# Patient Record
Sex: Female | Born: 1985 | Race: White | Hispanic: No | Marital: Married | Smoking: Never smoker
Health system: Southern US, Community
[De-identification: ages and names within clinical notes are randomized; demographics above are authoritative.]

## PROBLEM LIST (undated history)

## (undated) ENCOUNTER — Inpatient Hospital Stay (HOSPITAL_COMMUNITY): Payer: Self-pay

## (undated) DIAGNOSIS — G40009 Localization-related (focal) (partial) idiopathic epilepsy and epileptic syndromes with seizures of localized onset, not intractable, without status epilepticus: Secondary | ICD-10-CM

## (undated) DIAGNOSIS — C801 Malignant (primary) neoplasm, unspecified: Secondary | ICD-10-CM

## (undated) DIAGNOSIS — R569 Unspecified convulsions: Secondary | ICD-10-CM

## (undated) HISTORY — PX: WISDOM TOOTH EXTRACTION: SHX21

---

## 2009-04-01 ENCOUNTER — Emergency Department (HOSPITAL_COMMUNITY): Admission: EM | Admit: 2009-04-01 | Discharge: 2009-04-02 | Payer: Self-pay | Admitting: Emergency Medicine

## 2010-08-27 LAB — POCT I-STAT, CHEM 8
BUN: 9 mg/dL (ref 6–23)
Calcium, Ion: 1.16 mmol/L (ref 1.12–1.32)
Creatinine, Ser: 0.9 mg/dL (ref 0.4–1.2)
TCO2: 25 mmol/L (ref 0–100)

## 2010-08-27 LAB — URINALYSIS, ROUTINE W REFLEX MICROSCOPIC
Bilirubin Urine: NEGATIVE
Glucose, UA: NEGATIVE mg/dL
Hgb urine dipstick: NEGATIVE
Protein, ur: NEGATIVE mg/dL
Specific Gravity, Urine: 1.017 (ref 1.005–1.030)

## 2010-08-27 LAB — URINE CULTURE

## 2010-08-27 LAB — URINE MICROSCOPIC-ADD ON

## 2012-05-25 NOTE — L&D Delivery Note (Signed)
Delivery Note At 12:04 PM a viable and healthy female was delivered via Vaginal, Spontaneous Delivery (Presentation:ROA ;  ).  APGAR: 9, 9; weight 7lb 13 oz.   Placenta status: Intact, Spontaneous. Sent for path. CANx 1 reducibel Cord: 3 vessels with the following complications: None.  Cord pH: none  Anesthesia: Epidural  Episiotomy:none  Lacerations: Sulcus left vaginal Suture Repair: 3.0 chromic Est. Blood Loss (mL): 300  Mom to postpartum.  Baby to nursery-stable.  Darrly Loberg A 11/04/2012, 1:34 PM

## 2012-06-02 ENCOUNTER — Encounter: Payer: BC Managed Care – PPO | Attending: Obstetrics and Gynecology | Admitting: Dietician

## 2012-06-02 ENCOUNTER — Encounter: Payer: Self-pay | Admitting: Dietician

## 2012-06-02 VITALS — Ht 69.0 in | Wt 144.3 lb

## 2012-06-02 DIAGNOSIS — R638 Other symptoms and signs concerning food and fluid intake: Secondary | ICD-10-CM | POA: Insufficient documentation

## 2012-06-02 DIAGNOSIS — Z713 Dietary counseling and surveillance: Secondary | ICD-10-CM | POA: Insufficient documentation

## 2012-06-02 NOTE — Progress Notes (Signed)
  Medical Nutrition Therapy:  Appt start time: 1630 end time:  1730.   Assessment:  Primary concerns today: Concern for weight gain in pregnancy. Given her weight gain during the the first trimester, Gaylon was advised to gain weight and on hearing the need for increased weight, she has established an eating regimen for herself that has led to a gain of 7 lb since 05/06/2012.  She is feeling good.  Still has waves of nausea at times.  She is tolerating the increase in food.  She still has problems tolerating meats.  She is however using and tolerating PB, beans, and peas and cheese and other protein sources.  She is keeping a daily food diary and using My Fitness Pal and logging her calories and has a goal of at least 2200-2300 calories per day.     MEDICATIONS: Medication review completed.  Currently on pre-natal vitamins   DIETARY INTAKE:  Usual eating pattern includes 3 meals and 3 snacks per day.  Everyday foods include Fruits, vegetables, peanut butter. Marland Kitchen  Avoided foods include: Having more problems eating meat.    24-hr recall:  B ( AM): 8:30 600 calories PB and jelly toast, yogurt (greek) =/- banana, glass of OJ  Snk ( AM): 11:30 snack dry cereal or peanuts or fruit or instant oatmeal  L ( PM): 1:30 PM Leftovers Veggie lasagana, apple, blueberry bagel with cream cheese. water Snk ( PM): mid afternoon corn chips with cheese. D ( PM): 6:30-7:00 Pinto bean polenta pie. Salsa, chips cheese OR pizza with pesto and cheese and maybe mushrooms. 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  She is however, still having some fatigue and is looking to increase her activity.  Estimated energy needs:   Ht: 69 in  WT: 144.3 lb  BMI: 21.4 kg/m2   2200-2300calories 245-250 g carbohydrates Aim for 80 gm per day g protein 60-70 g fat  Progress Towards Goal(s):   Some progress.   Nutritional Diagnosis:  NI-1.4 Inadequate energy intake As related to limited weight gain in pregnancy during first trimester.  As evidenced by gain of 3 lb or less duing the first timester, episodes of nausea at that time accompanied by feelings of food aversions..    Intervention:  Nutrition Recommend continuing to keep your food diary.  Use it as a tool for 3-4 days out of a week for the next few weeks to try to keep you on track.  Continue to use the strategies that have worked for you and aim for the 2200-2300-2400 calories per day.  Begin to bring in the poultry when tolerated.  If fish feels like it could be tolerated, broiled or baked, then 3 oz twice a week can be used.  In the meanwhile continue to use the proteins in the form of the beans, nuts, nut butters and cheeses and yogurt ([place]).  Handouts given during visit include:  Eating Expectantly  Monitoring/Evaluation:  Dietary intake, exercise, and body weight when she feels she needs further assistance. She has my card and Brad Mcgaughy call or e-mail with questions.Marland Kitchen

## 2012-09-17 ENCOUNTER — Encounter (HOSPITAL_COMMUNITY): Payer: Self-pay | Admitting: *Deleted

## 2012-09-17 ENCOUNTER — Inpatient Hospital Stay (HOSPITAL_COMMUNITY)
Admission: AD | Admit: 2012-09-17 | Discharge: 2012-09-17 | Disposition: A | Discharge status: Home / Self Care | Payer: BC Managed Care – PPO | Source: Ambulatory Visit | Attending: Obstetrics and Gynecology | Admitting: Obstetrics and Gynecology

## 2012-09-17 DIAGNOSIS — E162 Hypoglycemia, unspecified: Secondary | ICD-10-CM | POA: Insufficient documentation

## 2012-09-17 DIAGNOSIS — O2650 Maternal hypotension syndrome, unspecified trimester: Secondary | ICD-10-CM | POA: Insufficient documentation

## 2012-09-17 DIAGNOSIS — R42 Dizziness and giddiness: Secondary | ICD-10-CM | POA: Insufficient documentation

## 2012-09-17 HISTORY — DX: Localization-related (focal) (partial) idiopathic epilepsy and epileptic syndromes with seizures of localized onset, not intractable, without status epilepticus: G40.009

## 2012-09-17 NOTE — MAU Note (Signed)
Pt reports she felt light headed and almost passed out lost vision for 30 seconds. This happen 20 min ago. Stated she did eat today. Denies pain and reports good fetal movement

## 2012-09-17 NOTE — MAU Provider Note (Signed)
  History     CSN: [id number]  Arrival date and time: 09/17/12 1154 Call to provider @ 1241 Provider here to see patient @ 1305     Chief Complaint  Patient presents with  . Near Syncope   HPI  Active FM Dizziness and lightheaded this am Felt like she would pass out  Ate granola bar and skim milk for breakfast / granola bar for snack and lunch  Past Medical History  Diagnosis Date  . Benign focal epilepsy of childhood     Past Surgical History  Procedure Laterality Date  . Wisdom tooth extraction      Family History  Problem Relation Age of Onset  . Cancer Mother   . Diverticulitis Father     History  Substance Use Topics  . Smoking status: Never Smoker   . Smokeless tobacco: Never Used  . Alcohol Use: No    Allergies: No Known Allergies  Prescriptions prior to admission  Medication Sig Dispense Refill  . OVER THE COUNTER MEDICATION Take 3 tablets by mouth daily. [employer] brand prenatal vitamins        ROS Physical Exam   Blood pressure 129/87, pulse 81, temperature 97.7 F (36.5 C), temperature source Oral, resp. rate 18, height 5\' 9"  (1.753 m), weight 75.932 kg (167 lb 6.4 oz), SpO2 100.00%.  Physical Exam NAD Alert and oriented Abdomen soft and non-tender  Pelvic - deferred  CBG 78 after snack on way to hospital  MAU Course  Procedures   Assessment and Plan  31 weeks Hypoglycemia episode - discussed dietary needs in third trimester Need for adequate protein intake with meals       especially breakfast after fasting all night and before HS in preparation of fasting all night Increase water intake Discussed good options for protein intake  DC home ROB with Dr Cherly Hensen Wed at [facility] 09/17/2012, 1:14 PM

## 2012-09-17 NOTE — MAU Note (Signed)
Patient presents to MAU with c/o feeling nauseous and seeing black spots, then briefly losing vision at 1140 today. Denies loss of consciousness or fall. Denies DM; reports routine breakfast of granola with skim milk and granola bar snacks.  Reports + fetal movement, denies vaginal bleeding, LOF or cramping.

## 2012-11-04 ENCOUNTER — Inpatient Hospital Stay (HOSPITAL_COMMUNITY)
Admission: AD | Admit: 2012-11-04 | Discharge: 2012-11-06 | DRG: 373 | Disposition: A | Discharge status: Home / Self Care | Payer: BC Managed Care – PPO | Source: Ambulatory Visit | Attending: Obstetrics and Gynecology | Admitting: Obstetrics and Gynecology

## 2012-11-04 ENCOUNTER — Encounter (HOSPITAL_COMMUNITY): Payer: Self-pay | Admitting: *Deleted

## 2012-11-04 ENCOUNTER — Inpatient Hospital Stay (HOSPITAL_COMMUNITY): Payer: BC Managed Care – PPO | Admitting: Anesthesiology

## 2012-11-04 ENCOUNTER — Encounter (HOSPITAL_COMMUNITY): Payer: Self-pay | Admitting: Anesthesiology

## 2012-11-04 DIAGNOSIS — O99892 Other specified diseases and conditions complicating childbirth: Secondary | ICD-10-CM | POA: Diagnosis present

## 2012-11-04 DIAGNOSIS — Z22330 Carrier of Group B streptococcus: Secondary | ICD-10-CM

## 2012-11-04 LAB — CBC
MCHC: 35 g/dL (ref 30.0–36.0)
Platelets: 269 10*3/uL (ref 150–400)
RDW: 12.5 % (ref 11.5–15.5)
WBC: 10 10*3/uL (ref 4.0–10.5)

## 2012-11-04 LAB — OB RESULTS CONSOLE HEPATITIS B SURFACE ANTIGEN: Hepatitis B Surface Ag: NEGATIVE

## 2012-11-04 LAB — RPR: RPR Ser Ql: NONREACTIVE

## 2012-11-04 LAB — OB RESULTS CONSOLE GC/CHLAMYDIA: Chlamydia: NEGATIVE

## 2012-11-04 LAB — OB RESULTS CONSOLE RPR: RPR: NONREACTIVE

## 2012-11-04 LAB — OB RESULTS CONSOLE RUBELLA ANTIBODY, IGM: Rubella: IMMUNE

## 2012-11-04 LAB — OB RESULTS CONSOLE HIV ANTIBODY (ROUTINE TESTING): HIV: NONREACTIVE

## 2012-11-04 MED ORDER — FLEET ENEMA 7-19 GM/118ML RE ENEM: 1.0000 | ENEMA | RECTAL | Status: DC | PRN

## 2012-11-04 MED ORDER — OXYTOCIN 40 UNITS IN LACTATED RINGERS INFUSION - SIMPLE MED: 62.5000 mL/h | INTRAVENOUS | Status: DC

## 2012-11-04 MED ORDER — PENICILLIN G POTASSIUM 5000000 UNITS IJ SOLR
2.5000 10*6.[IU] | INTRAVENOUS | Status: DC
  Administered 2012-11-04: 2.5 10*6.[IU] via INTRAVENOUS
  Filled 2012-11-04 (×6): qty 2.5

## 2012-11-04 MED ORDER — SIMETHICONE 80 MG PO CHEW: 80.0000 mg | CHEWABLE_TABLET | ORAL | Status: DC | PRN

## 2012-11-04 MED ORDER — PENICILLIN G POTASSIUM 5000000 UNITS IJ SOLR
5.0000 10*6.[IU] | Freq: Once | INTRAVENOUS | Status: AC
  Administered 2012-11-04: 5 10*6.[IU] via INTRAVENOUS
  Filled 2012-11-04: qty 5

## 2012-11-04 MED ORDER — LIDOCAINE HCL (PF) 1 % IJ SOLN
30.0000 mL | INTRAMUSCULAR | Status: DC | PRN
  Filled 2012-11-04 (×2): qty 30

## 2012-11-04 MED ORDER — OXYCODONE-ACETAMINOPHEN 5-325 MG PO TABS
1.0000 | ORAL_TABLET | ORAL | Status: DC | PRN
Start: 2012-11-04 — End: 2012-11-04

## 2012-11-04 MED ORDER — NALBUPHINE SYRINGE 5 MG/0.5 ML
10.0000 mg | INJECTION | INTRAMUSCULAR | Status: DC | PRN
  Filled 2012-11-04 (×2): qty 1

## 2012-11-04 MED ORDER — CITRIC ACID-SODIUM CITRATE 334-500 MG/5ML PO SOLN: 30.0000 mL | ORAL | Status: DC | PRN

## 2012-11-04 MED ORDER — BENZOCAINE-MENTHOL 20-0.5 % EX AERO
1.0000 "application " | INHALATION_SPRAY | CUTANEOUS | Status: DC | PRN
  Administered 2012-11-04: 1 via TOPICAL
  Filled 2012-11-04: qty 56

## 2012-11-04 MED ORDER — DIPHENHYDRAMINE HCL 25 MG PO CAPS: 25.0000 mg | ORAL_CAPSULE | Freq: Four times a day (QID) | ORAL | Status: DC | PRN

## 2012-11-04 MED ORDER — ZOLPIDEM TARTRATE 5 MG PO TABS: 5.0000 mg | ORAL_TABLET | Freq: Every evening | ORAL | Status: DC | PRN

## 2012-11-04 MED ORDER — DIPHENHYDRAMINE HCL 50 MG/ML IJ SOLN: 12.5000 mg | INTRAMUSCULAR | Status: DC | PRN

## 2012-11-04 MED ORDER — EPHEDRINE 5 MG/ML INJ
10.0000 mg | INTRAVENOUS | Status: DC | PRN
  Filled 2012-11-04: qty 2

## 2012-11-04 MED ORDER — ONDANSETRON HCL 4 MG/2ML IJ SOLN: 4.0000 mg | Freq: Four times a day (QID) | INTRAMUSCULAR | Status: DC | PRN

## 2012-11-04 MED ORDER — ONDANSETRON HCL 4 MG PO TABS: 4.0000 mg | ORAL_TABLET | ORAL | Status: DC | PRN

## 2012-11-04 MED ORDER — OXYCODONE-ACETAMINOPHEN 5-325 MG PO TABS
1.0000 | ORAL_TABLET | ORAL | Status: DC | PRN
  Filled 2012-11-04: qty 2

## 2012-11-04 MED ORDER — SENNOSIDES-DOCUSATE SODIUM 8.6-50 MG PO TABS
2.0000 | ORAL_TABLET | Freq: Every day | ORAL | Status: DC
  Administered 2012-11-05 (×2): 2 via ORAL

## 2012-11-04 MED ORDER — ACETAMINOPHEN 325 MG PO TABS: 650.0000 mg | ORAL_TABLET | ORAL | Status: DC | PRN

## 2012-11-04 MED ORDER — TETANUS-DIPHTH-ACELL PERTUSSIS 5-2.5-18.5 LF-MCG/0.5 IM SUSP
0.5000 mL | Freq: Once | INTRAMUSCULAR | Status: AC
  Administered 2012-11-05: 0.5 mL via INTRAMUSCULAR
  Filled 2012-11-04: qty 0.5

## 2012-11-04 MED ORDER — OXYTOCIN BOLUS FROM INFUSION: 500.0000 mL | INTRAVENOUS | Status: DC

## 2012-11-04 MED ORDER — IBUPROFEN 600 MG PO TABS: 600.0000 mg | ORAL_TABLET | Freq: Four times a day (QID) | ORAL | Status: DC | PRN

## 2012-11-04 MED ORDER — LANOLIN HYDROUS EX OINT: TOPICAL_OINTMENT | CUTANEOUS | Status: DC | PRN

## 2012-11-04 MED ORDER — SODIUM BICARBONATE 8.4 % IV SOLN
INTRAVENOUS | Status: DC | PRN
  Administered 2012-11-04: 5 mL via EPIDURAL

## 2012-11-04 MED ORDER — LACTATED RINGERS IV SOLN
INTRAVENOUS | Status: DC
  Administered 2012-11-04: 02:00:00 via INTRAVENOUS

## 2012-11-04 MED ORDER — LACTATED RINGERS IV SOLN: 500.0000 mL | Freq: Once | INTRAVENOUS | Status: DC

## 2012-11-04 MED ORDER — LACTATED RINGERS IV SOLN: 500.0000 mL | INTRAVENOUS | Status: DC | PRN

## 2012-11-04 MED ORDER — PHENYLEPHRINE 40 MCG/ML (10ML) SYRINGE FOR IV PUSH (FOR BLOOD PRESSURE SUPPORT)
80.0000 ug | PREFILLED_SYRINGE | INTRAVENOUS | Status: DC | PRN
  Filled 2012-11-04: qty 2

## 2012-11-04 MED ORDER — WITCH HAZEL-GLYCERIN EX PADS: 1.0000 "application " | MEDICATED_PAD | CUTANEOUS | Status: DC | PRN

## 2012-11-04 MED ORDER — ONDANSETRON HCL 4 MG/2ML IJ SOLN: 4.0000 mg | INTRAMUSCULAR | Status: DC | PRN

## 2012-11-04 MED ORDER — PHENYLEPHRINE 40 MCG/ML (10ML) SYRINGE FOR IV PUSH (FOR BLOOD PRESSURE SUPPORT)
80.0000 ug | PREFILLED_SYRINGE | INTRAVENOUS | Status: DC | PRN
  Filled 2012-11-04: qty 2
  Filled 2012-11-04: qty 5

## 2012-11-04 MED ORDER — FENTANYL 2.5 MCG/ML BUPIVACAINE 1/10 % EPIDURAL INFUSION (WH - ANES)
14.0000 mL/h | INTRAMUSCULAR | Status: DC | PRN
  Administered 2012-11-04: 14 mL/h via EPIDURAL
  Filled 2012-11-04: qty 125

## 2012-11-04 MED ORDER — IBUPROFEN 600 MG PO TABS
600.0000 mg | ORAL_TABLET | Freq: Four times a day (QID) | ORAL | Status: DC
  Administered 2012-11-05 – 2012-11-06 (×3): 600 mg via ORAL
  Filled 2012-11-04 (×4): qty 1

## 2012-11-04 MED ORDER — PRENATAL MULTIVITAMIN CH
1.0000 | ORAL_TABLET | Freq: Every day | ORAL | Status: DC
  Administered 2012-11-05 – 2012-11-06 (×2): 1 via ORAL
  Filled 2012-11-04 (×2): qty 1

## 2012-11-04 MED ORDER — EPHEDRINE 5 MG/ML INJ
10.0000 mg | INTRAVENOUS | Status: DC | PRN
  Filled 2012-11-04: qty 2
  Filled 2012-11-04: qty 4

## 2012-11-04 MED ORDER — FERROUS SULFATE 325 (65 FE) MG PO TABS
325.0000 mg | ORAL_TABLET | Freq: Two times a day (BID) | ORAL | Status: DC
  Administered 2012-11-05: 325 mg via ORAL
  Administered 2012-11-05: 12:00:00 via ORAL
  Administered 2012-11-06: 325 mg via ORAL
  Filled 2012-11-04 (×3): qty 1

## 2012-11-04 MED ORDER — OXYTOCIN 40 UNITS IN LACTATED RINGERS INFUSION - SIMPLE MED
1.0000 m[IU]/min | INTRAVENOUS | Status: DC
  Administered 2012-11-04: 2 m[IU]/min via INTRAVENOUS
  Filled 2012-11-04: qty 1000

## 2012-11-04 MED ORDER — TERBUTALINE SULFATE 1 MG/ML IJ SOLN: 0.2500 mg | Freq: Once | INTRAMUSCULAR | Status: DC | PRN

## 2012-11-04 MED ORDER — DIBUCAINE 1 % RE OINT: 1.0000 "application " | TOPICAL_OINTMENT | RECTAL | Status: DC | PRN

## 2012-11-04 NOTE — MAU Note (Signed)
Pt presents with srom at 2330 this pm.  Clear fluid per pt.  Pt with + fm.  Denies bleeding or pih symptoms.  + GBBS

## 2012-11-04 NOTE — Plan of Care (Signed)
Problem: Phase I Progression Outcomes Goal: Pain controlled with appropriate interventions Outcome: Completed/Met Date Met:  11/04/12 Pt. Has no hx of pain medications except tylenol, excedrin, etc. Prefers to avoid pain medications and   narcotics, explained that medication is as needed with Motrin scheduled every 6 hours.

## 2012-11-04 NOTE — H&P (Signed)
Krystal Jefferson is a 27 y.o. female presenting in labor with SROM  (+) FM GBS cx (+) Maternal Medical History:  Reason for admission: Rupture of membranes.   Contractions: Onset was 3-5 hours ago.   Frequency: irregular.   Perceived severity is mild.    Fetal activity: Perceived fetal activity is normal.    Prenatal complications: no prenatal complications Prenatal Complications - Diabetes: none.    OB History   Grav Para Term Preterm Abortions TAB SAB Ect Mult Living   1              Past Medical History  Diagnosis Date  . Benign focal epilepsy of childhood    Past Surgical History  Procedure Laterality Date  . Wisdom tooth extraction     Family History: family history includes Cancer in her mother and Diverticulitis in her father. Social History:  reports that she has never smoked. She has never used smokeless tobacco. She reports that she does not drink alcohol or use illicit drugs.   Prenatal Transfer Tool  Maternal Diabetes: No Genetic Screening: Normal Maternal Ultrasounds/Referrals: Normal Fetal Ultrasounds or other Referrals:  None Maternal Substance Abuse:  No Significant Maternal Medications:  None Significant Maternal Lab Results:  Lab values include: Group B Strep positive Other Comments:  None  Review of Systems  All other systems reviewed and are negative.    Dilation: 6 Effacement (%): 100 Station: +1;0 Exam by:: a. white e. foley rn Blood pressure 134/69, pulse 75, temperature 98.4 F (36.9 C), temperature source Oral, resp. rate 20, height 5\' 9"  (1.753 m), weight 78.926 kg (174 lb). Maternal Exam:  Uterine Assessment: Contraction strength is mild.  Abdomen: Patient reports no abdominal tenderness. Estimated fetal weight is 7lb.   Fetal presentation: vertex  Introitus: Normal vulva. Amniotic fluid character: clear.  Cervix: Cervix evaluated by digital exam.     Physical Exam  Constitutional: She is oriented to person, place, and time.  She appears well-developed and well-nourished.  HENT:  Head: Atraumatic.  Eyes: EOM are normal.  Neck: Neck supple.  Cardiovascular: Normal rate and regular rhythm.   Respiratory: Breath sounds normal.  GI: Soft.  Musculoskeletal: She exhibits no edema.  Neurological: She is alert and oriented to person, place, and time.  Skin: Skin is warm and dry.  Psychiatric: She has a normal mood and affect.    Prenatal labs: ABO, Rh: A/Positive/-- (06/13 0000) Antibody: Negative (06/13 0000) Rubella: Immune (06/13 0000) RPR: Nonreactive (06/13 0000)  HBsAg: Negative (06/13 0000)  HIV: Non-reactive (06/13 0000)  GBS: Positive (06/13 0000)   Assessment/Plan: SROM Term gestation GBS cx (+) P) admit routine labs. IV PCN epidural prn. Pitocin augmentation   Krystal Jefferson A 11/04/2012, 5:23 AM

## 2012-11-04 NOTE — Anesthesia Procedure Notes (Signed)

## 2012-11-04 NOTE — Anesthesia Postprocedure Evaluation (Signed)
Anesthesia Post Note  Patient: Krystal Jefferson  Procedure(s) Performed: * No procedures listed *  Anesthesia type: Epidural  Patient location: Mother/Baby  Post pain: Pain level controlled  Post assessment: Post-op Vital signs reviewed  Last Vitals:  Filed Vitals:   11/04/12 1331  BP: 135/78  Pulse: 89  Temp:   Resp: 16    Post vital signs: Reviewed  Level of consciousness:alert  Complications: No apparent anesthesia complications

## 2012-11-04 NOTE — Anesthesia Preprocedure Evaluation (Signed)

## 2012-11-04 NOTE — Progress Notes (Signed)
Krystal Jefferson is a 27 y.o. G1P0 at [redacted]w[redacted]d by LMP admitted for active labor, rupture of membranes GBS cx (+)  Subjective: Chief Complaint  Patient presents with  . Labor Eval    Objective: BP 128/73  Pulse 76  Temp(Src) 98.1 F (36.7 C) (Oral)  Resp 16  Ht 5\' 9"  (1.753 m)  Wt 78.926 kg (174 lb)  BMI 25.68 kg/m2  SpO2 100%      FHT:  FHR: 145 bpm, variability: minimal ,  accelerations:  Present,  decelerations:  Absent UC:   regular, every 2-3 minutes SVE:   Complete/ (+)1/2 station  asynclytic LOP Tracing: reassuring Cat 1  Labs: Lab Results  Component Value Date   WBC 10.0 11/04/2012   HGB 12.5 11/04/2012   HCT 35.7* 11/04/2012   MCV 86.9 11/04/2012   PLT 269 11/04/2012    Assessment / Plan: Augmentation of labor, progressing well SROM on IV PCN Term gestation P) left exaggerated sims. Labor vtx down Foley catheter  Anticipated MOD:  NSVD  Breken Nazari A 11/04/2012, 7:45 AM

## 2012-11-05 ENCOUNTER — Encounter (HOSPITAL_COMMUNITY): Payer: Self-pay | Admitting: Obstetrics and Gynecology

## 2012-11-05 LAB — CBC
HCT: 36.1 % (ref 36.0–46.0)
Hemoglobin: 12.2 g/dL (ref 12.0–15.0)
MCH: 30 pg (ref 26.0–34.0)
MCHC: 33.8 g/dL (ref 30.0–36.0)
RBC: 4.07 MIL/uL (ref 3.87–5.11)

## 2012-11-05 NOTE — Lactation Note (Signed)
This note was copied from the chart of Girl Byron Tipping. Lactation Consultation Note  Patient Name: Girl Clorene Nerio Today's Date: 11/05/2012 Reason for consult: Initial assessment   Maternal Data Formula Feeding for Exclusion: No Infant to breast within first hour of birth: Yes Has patient been taught Hand Expression?: Yes Does the patient have breastfeeding experience prior to this delivery?: No  Feeding Feeding Type: Breast Milk Feeding method: Breast Length of feed: 10 min  LATCH Score/Interventions Latch: Grasps breast easily, tongue down, lips flanged, rhythmical sucking.  Audible Swallowing: A few with stimulation  Type of Nipple: Everted at rest and after stimulation  Comfort (Breast/Nipple): Filling, red/small blisters or bruises, mild/mod discomfort  Problem noted: Mild/Moderate discomfort Interventions (Mild/moderate discomfort): Comfort gels  Hold (Positioning): Assistance needed to correctly position infant at breast and maintain latch. Intervention(s): Breastfeeding basics reviewed;Support Pillows;Skin to skin  LATCH Score: 7  Lactation Tools Discussed/Used Tools: Comfort gels   Consult Status Consult Status: Follow-up Date: 11/06/12 Follow-up type: In-patient  Initial visit with mom. Left nipple very sore per mom- raw on tip and bruising noted on areola. Assisted mom with latch to right breast in football hold. Mom reports that it hurts through the first few minutes but then eases off. Baby with tight frenulum but is able to extend tongue slightly over gumline. Dr Tommi Emery in and discussed frenulum with her. Mom had her frenulum clipped at age 17. Comfort gels given with instructions for use and cleaning. Reviewed basic teaching with parents. No questions at present. BF brochure given with resources for support after DC. To call prn.  Pamelia Hoit 11/05/2012, 9:18 AM

## 2012-11-05 NOTE — Progress Notes (Signed)
Patient ID: Krystal Jefferson, female   DOB: 09-24-1985, 27 y.o.   MRN: [id number] PPD # 1  Subjective: Pt reports feeling well/ Pain controlled with rare ibuprofen Tolerating po/ Voiding without problems/ No n/v Bleeding is light Newborn info:  Information for the patient's newborn:  Marijke, [place] [[id number]]  female Feeding: breast   Objective:  VS: Blood pressure 125/78, pulse 69, temperature 97.4 F (36.3 C), temperature source Oral, resp. rate 18.   Recent Labs  11/04/12 0140 11/05/12 0630  WBC 10.0 13.0*  HGB 12.5 12.2  HCT 35.7* 36.1  PLT 269 235    Blood type: A/Positive/-- (06/13 0000) Rubella: Immune (06/13 0000)    Physical Exam:  General: A & O x 3  alert, cooperative and no distress CV: Regular rate and rhythm Resp: clear Abdomen: soft, nontender, normal bowel sounds Uterine Fundus: firm, below umbilicus, nontender Perineum: healing with good reapproximation Lochia: minimal Ext: edema trace and Homans sign is negative, no sign of DVT   A/P: PPD # 1/ G1P1001/ S/P: SVD w/ sulcus repair Doing well Continue routine post partum orders Anticipate D/C home in AM    Demetrius Revel, MSN, [facility] 11/05/2012, 9:50 AM

## 2012-11-06 MED ORDER — IBUPROFEN 600 MG PO TABS: 600.0000 mg | ORAL_TABLET | Freq: Four times a day (QID) | ORAL | Status: DC | PRN

## 2012-11-06 NOTE — Discharge Summary (Signed)
Obstetric Discharge Summary Reason for Admission: G1 P0 @ 38w 5d with SROM, spontaneous labor; GBS Pos Prenatal Procedures: NST and ultrasound Intrapartum Procedures: spontaneous vaginal delivery Postpartum Procedures: none Complications-Operative and Postpartum: vaginal laceration Left sulcus Hemoglobin  Date Value Range Status  11/05/2012 12.2  12.0 - 15.0 g/dL Final     HCT  Date Value Range Status  11/05/2012 36.1  36.0 - 46.0 % Final    Physical Exam:  General: alert, cooperative and no distress Lochia: appropriate Uterine Fundus: firm Incision: na DVT Evaluation: No evidence of DVT seen on physical exam. Negative Homan's sign.  Discharge Diagnoses: G1 P1 S/P SVD @ 39wks with left vaginal sulcus laceration  Discharge Information: Date: 11/06/2012 Activity: pelvic rest Diet: routine Medications: PNV, Ibuprofen and Colace Condition: stable Instructions: refer to practice specific booklet Discharge to: home Follow-up Information   Follow up with COUSINS,SHERONETTE A, MD In 6 weeks.   Contact information:   [address] [place] [address] [phone]       Newborn Data: Live born female on 11/04/12 Birth Weight: 7 lb 13 oz (3544 g) APGAR: 9, 9  Home with mother.  Cherise Fedder K 11/06/2012, 9:01 AM

## 2012-11-06 NOTE — Progress Notes (Signed)
Patient ID: Krystal Jefferson, female   DOB: May 23, 1986, 27 y.o.   MRN: [id number] PPD # 2  Subjective: Pt reports feeling well and desires d/c home.  Moderate anxiety r/t infant with tight frenulum and in need of surgical repair.  Has been reassured by peds ok to go home Pain controlled with ibuprofen Tolerating po/ Voiding without problems/ No n/v Bleeding is light/ Newborn info:  Information for the patient's newborn:  Krystal Jefferson, [name] [[id number]]  female  Feeding: breast    Objective:  VS: Blood pressure 115/73, pulse 82, temperature 97.9 F (36.6 C), temperature source Oral, resp. rate 18.    Recent Labs  11/04/12 0140 11/05/12 0630  WBC 10.0 13.0*  HGB 12.5 12.2  HCT 35.7* 36.1  PLT 269 235    Blood type: A/Positive/-- (06/13 0000) Rubella: Immune (06/13 0000)    Physical Exam:  General: A & O x 3  alert, cooperative and no distress CV: Regular rate and rhythm Resp: clear Abdomen: soft, nontender, normal bowel sounds Uterine Fundus: firm, below umbilicus, nontender Perineum: healing with good reapproximation Lochia: minimal Ext: Homans sign is negative, no sign of DVT and no edema, redness or tenderness in the calves or thighs    A/P: PPD # 2/ G1P1001/ S/P: SVD, w/sulcus tear Doing well and stable for discharge home RX: Ibuprofen 600mg  po Q 6 hrs prn pain #30 Refill x 1 WOB/GYN booklet given Routine pp visit in 6wks   Krystal Revel, MSN, [facility] 11/06/2012, 9:00 AM

## 2012-11-10 ENCOUNTER — Ambulatory Visit (HOSPITAL_COMMUNITY)
Admission: RE | Admit: 2012-11-10 | Discharge: 2012-11-10 | Disposition: A | Discharge status: Home / Self Care | Payer: BC Managed Care – PPO | Source: Ambulatory Visit | Attending: Obstetrics and Gynecology | Admitting: Obstetrics and Gynecology

## 2012-11-10 NOTE — Lactation Note (Signed)
Adult Lactation Consultation Outpatient Visit Note  Patient Name: Krystal Jefferson(MOTHER)   BABY: Krystal Jefferson Date of Birth: Sep 13, 1985                                   DOB; [birth year] Gestational Age at Delivery: [redacted]w[redacted]d                BIRTH WEIGHT: 7-13 Type of Delivery: SVD                                     DISCHARGE WEIGHT: 7-6                                                                          WEIGHT TODAY:7-10.6 Breastfeeding History: Frequency of Breastfeeding: every 2 hours Length of Feeding: 45 minutes Voids: 6+ Stools:6 yellow   Supplementing / Method:None Pumping:  Type of Pump:DEBP   Frequency:2X/DAY  Volume:  FOR COMFORT ONLY 20-30 MLS  Comments:    Consultation Evaluation:Mom and 6 day old infant here for feeding assessment.  Mom experienced cracked nipples in the hospital and it was noted by [facility] and pediatrician that baby had a tight anterior frenulum.  Parent's were referred to an oral surgeon for possible frenectomy. Parent's state that baby saw oral surgeon today and frenectomy done without problems.  Mom put newborn to breast immediately after and could tell much improvement in comfort.  Mom reports no further nipple pain with feedings.  Observed mom latch baby to breast using cross cradle hold.  Baby initially obtained shallow latch.  Techniques for holding breast and breast compression demonstrated to parents to assist with easier and deeper latch.  Baby opened wide and latched deeper using techniques.  Breasts are full with duct fullness in areas.  Instructed on breast massage to increase milk flow.  Breast softened significantly with feeding. Initial Feeding Assessment:LEFT BREAST X 30 MINUTES Pre-feed [id number] Post-feed [id number] Amount Transferred:38 MLS Comments:  Additional Feeding Assessment: Pre-feed Weight: Post-feed Weight: Amount Transferred: Comments:  Additional Feeding Assessment: Pre-feed Weight: Post-feed Weight: Amount  Transferred: Comments:  Total Breast milk Transferred this Visit: 38 MLS Total Supplement Given: NONE  Additional Interventions:   Follow-Up  PEDI 2 WEEKS, WILL CALL LC OFFICE PRN AND TRY TO ATTEND BF SUPPORT GROUP      Hansel Feinstein 11/10/2012, 11:17 AM

## 2014-03-08 ENCOUNTER — Other Ambulatory Visit: Payer: Self-pay | Admitting: Internal Medicine

## 2014-03-08 DIAGNOSIS — E041 Nontoxic single thyroid nodule: Secondary | ICD-10-CM

## 2014-03-16 ENCOUNTER — Ambulatory Visit (HOSPITAL_COMMUNITY): Payer: BC Managed Care – PPO

## 2014-03-21 ENCOUNTER — Ambulatory Visit (HOSPITAL_COMMUNITY)
Admission: RE | Admit: 2014-03-21 | Discharge: 2014-03-21 | Disposition: A | Discharge status: Home / Self Care | Payer: BC Managed Care – PPO | Source: Ambulatory Visit | Attending: Interventional Radiology | Admitting: Interventional Radiology

## 2014-03-21 DIAGNOSIS — E041 Nontoxic single thyroid nodule: Secondary | ICD-10-CM | POA: Insufficient documentation

## 2014-03-26 ENCOUNTER — Encounter (HOSPITAL_COMMUNITY): Payer: Self-pay | Admitting: Obstetrics and Gynecology

## 2014-03-28 ENCOUNTER — Other Ambulatory Visit: Payer: Self-pay | Admitting: Internal Medicine

## 2014-03-28 DIAGNOSIS — E041 Nontoxic single thyroid nodule: Secondary | ICD-10-CM

## 2014-04-04 ENCOUNTER — Other Ambulatory Visit (HOSPITAL_COMMUNITY)
Admission: RE | Admit: 2014-04-04 | Discharge: 2014-04-04 | Disposition: A | Discharge status: Home / Self Care | Payer: BC Managed Care – PPO | Source: Ambulatory Visit | Attending: Interventional Radiology | Admitting: Interventional Radiology

## 2014-04-04 ENCOUNTER — Ambulatory Visit
Admission: RE | Admit: 2014-04-04 | Discharge: 2014-04-04 | Disposition: A | Discharge status: Home / Self Care | Payer: BC Managed Care – PPO | Source: Ambulatory Visit | Attending: Internal Medicine | Admitting: Internal Medicine

## 2014-04-04 DIAGNOSIS — E041 Nontoxic single thyroid nodule: Secondary | ICD-10-CM | POA: Insufficient documentation

## 2014-05-30 ENCOUNTER — Ambulatory Visit (INDEPENDENT_AMBULATORY_CARE_PROVIDER_SITE_OTHER): Payer: Self-pay | Admitting: Surgery

## 2014-06-12 NOTE — Patient Instructions (Signed)
Krystal Jefferson  06/12/2014   Your procedure is scheduled on: Monday January 25th, 2016  Report to [facility] Main  Entrance and follow signs to               [place] at 1130 AM.  Call this number if you have problems the morning of surgery [phone]   Remember:  Do not eat food :After Midnight night before surgery, may have clear liquids from midnight night before surgery until 0730 am day of surgery, then nothing by mouth after 730 am day of surgery.   CLEAR LIQUID DIET   Foods Allowed                                                                     Foods Excluded  Coffee and tea, regular and decaf                             liquids that you cannot  Plain Jell-O in any flavor                                             see through such as: Fruit ices (not with fruit pulp)                                     milk, soups, orange juice  Iced Popsicles                                    All solid food Carbonated beverages, regular and diet                                    Cranberry, grape and apple juices Sports drinks like Gatorade Lightly seasoned clear broth or consume(fat free) Sugar, honey syrup  Sample Menu Breakfast                                Lunch                                     Supper Cranberry juice                    Beef broth                            Chicken broth Jell-O                                     Grape juice  Apple juice Coffee or tea                        Jell-O                                      Popsicle                                                Coffee or tea                        Coffee or tea  _____________________________________________________________________       Take these medicines the morning of surgery with A SIP OF WATER:                                You may not have any metal on your body including hair pins and              piercings  Do not wear  jewelry, make-up, lotions, powders or perfumes.             Do not wear nail polish.  Do not shave  48 hours prior to surgery.              Men may shave face and neck.   Do not bring valuables to the hospital. [place].  Contacts, dentures or bridgework may not be worn into surgery.  Leave suitcase in the car. After surgery it may be brought to your room.     Patients discharged the day of surgery will not be allowed to drive home.  Name and phone number of your driver:  Special Instructions: N/A              Please read over the following fact sheets you were given: _____________________________________________________________________             [facility] - Preparing for Surgery Before surgery, you can play an important role.  Because skin is not sterile, your skin needs to be as free of germs as possible.  You can reduce the number of germs on your skin by washing with CHG (chlorahexidine gluconate) soap before surgery.  CHG is an antiseptic cleaner which kills germs and bonds with the skin to continue killing germs even after washing. Please DO NOT use if you have an allergy to CHG or antibacterial soaps.  If your skin becomes reddened/irritated stop using the CHG and inform your nurse when you arrive at Short Stay. Do not shave (including legs and underarms) for at least 48 hours prior to the first CHG shower.  You may shave your face/neck. Please follow these instructions carefully:  1.  Shower with CHG Soap the night before surgery and the  morning of Surgery.  2.  If you choose to wash your hair, wash your hair first as usual with your  normal  shampoo.  3.  After you shampoo, rinse your hair and body thoroughly to remove the  shampoo.  4.  Use CHG as you would any other liquid soap.  You can apply chg directly  to the skin and wash                       Gently with a scrungie or clean washcloth.  5.   Apply the CHG Soap to your body ONLY FROM THE NECK DOWN.   Do not use on face/ open                           Wound or open sores. Avoid contact with eyes, ears mouth and genitals (private parts).                       Wash face,  Genitals (private parts) with your normal soap.             6.  Wash thoroughly, paying special attention to the area where your surgery  will be performed.  7.  Thoroughly rinse your body with warm water from the neck down.  8.  DO NOT shower/wash with your normal soap after using and rinsing off  the CHG Soap.                9.  Pat yourself dry with a clean towel.            10.  Wear clean pajamas.            11.  Place clean sheets on your bed the night of your first shower and do not  sleep with pets. Day of Surgery : Do not apply any lotions/deodorants the morning of surgery.  Please wear clean clothes to the hospital/surgery center.  FAILURE TO FOLLOW THESE INSTRUCTIONS MAY RESULT IN THE CANCELLATION OF YOUR SURGERY PATIENT SIGNATURE_________________________________  NURSE SIGNATURE__________________________________  ________________________________________________________________________

## 2014-06-13 ENCOUNTER — Ambulatory Visit (HOSPITAL_COMMUNITY)
Admission: RE | Admit: 2014-06-13 | Discharge: 2014-06-13 | Disposition: A | Discharge status: Home / Self Care | Payer: BLUE CROSS/BLUE SHIELD | Source: Ambulatory Visit | Attending: Anesthesiology | Admitting: Anesthesiology

## 2014-06-13 ENCOUNTER — Encounter (HOSPITAL_COMMUNITY)
Admission: RE | Admit: 2014-06-13 | Discharge: 2014-06-13 | Disposition: A | Discharge status: Home / Self Care | Payer: BLUE CROSS/BLUE SHIELD | Source: Ambulatory Visit | Attending: Surgery | Admitting: Surgery

## 2014-06-13 ENCOUNTER — Encounter (HOSPITAL_COMMUNITY): Payer: Self-pay

## 2014-06-13 DIAGNOSIS — C73 Malignant neoplasm of thyroid gland: Secondary | ICD-10-CM | POA: Diagnosis not present

## 2014-06-13 DIAGNOSIS — Z01818 Encounter for other preprocedural examination: Secondary | ICD-10-CM | POA: Diagnosis not present

## 2014-06-13 DIAGNOSIS — Z01812 Encounter for preprocedural laboratory examination: Secondary | ICD-10-CM | POA: Diagnosis present

## 2014-06-13 HISTORY — DX: Unspecified convulsions: R56.9

## 2014-06-13 HISTORY — DX: Malignant (primary) neoplasm, unspecified: C80.1

## 2014-06-13 LAB — CBC
HCT: 44 % (ref 36.0–46.0)
Hemoglobin: 15.6 g/dL — ABNORMAL HIGH (ref 12.0–15.0)
MCH: 31.6 pg (ref 26.0–34.0)
MCHC: 35.5 g/dL (ref 30.0–36.0)
MCV: 89.1 fL (ref 78.0–100.0)
PLATELETS: 235 10*3/uL (ref 150–400)
RBC: 4.94 MIL/uL (ref 3.87–5.11)
RDW: 12.4 % (ref 11.5–15.5)
WBC: 5.1 10*3/uL (ref 4.0–10.5)

## 2014-06-13 LAB — HCG, SERUM, QUALITATIVE: PREG SERUM: NEGATIVE

## 2014-06-13 NOTE — Progress Notes (Signed)
Quick Note:  These results are acceptable for scheduled surgery.  Jhamal Plucinski M. Chaneka Trefz, MD, FACS [facility] Office: [phone]   ______ 

## 2014-06-13 NOTE — Progress Notes (Signed)
Quick Note:  Pre-operative chest x-ray is acceptable for scheduled surgery.  Krystal Whitenack M. Tomasita Beevers, MD, FACS [facility] Office: [phone]   ______ 

## 2014-06-17 ENCOUNTER — Encounter (HOSPITAL_COMMUNITY): Payer: Self-pay | Admitting: Surgery

## 2014-06-17 DIAGNOSIS — C73 Malignant neoplasm of thyroid gland: Secondary | ICD-10-CM | POA: Diagnosis present

## 2014-06-17 NOTE — H&P (Signed)
General Surgery [facility]  Jacklyn A. Munroe 05/30/2014 9:44 AM Location: [facility] Patient #: [id number] DOB: July 17, 1985 Married / Language: English / Race: White Female  History of Present Illness  The patient is a 29 year old female who presents with thyroid cancer. Patient is referred by Dr. Legrand Como Altheimer for newly diagnosed papillary thyroid carcinoma. Patient was found on routine physical examination by her gynecologist to have a palpable thyroid nodule. Subsequent ultrasound performed in October 2015 showed a solitary 3.8 cm mass in the right thyroid lobe. This contained microcalcifications. Fine-needle aspiration biopsy was performed and findings were consistent with papillary thyroid carcinoma. Patient was seen by endocrinology. She is now referred for thyroidectomy for management. Patient has no prior history of thyroid disease. However she may have been exposed as a child to radiation while living in [place] at the time of the [place] disaster. There is also a family history of papillary thyroid carcinoma in the patient's mother who was treated with surgery and radioactive iodine and has had no recurrent disease. There is no other family history of endocrine neoplasm. Patient has had no prior head or neck surgery. Patient has never been on thyroid medication. She denies tremors or palpitations.   Other Problems Joseph Pierini, LPN; 09/25/6642 0:34 AM) Seizure Disorder Thyroid Cancer  Past Surgical History Joseph Pierini, LPN; 11/25/2593 6:38 AM) No pertinent past surgical history  Diagnostic Studies History Joseph Pierini, LPN; 11/27/6431 2:95 AM) Colonoscopy never Mammogram never Pap Smear 1-5 years ago  Allergies Joseph Pierini, LPN; 06/01/8414 6:06 AM) No Known Drug Allergies01/10/2014  Medication History Joseph Pierini, LPN; 3/0/1601 0:93 AM) No Current Medications  Social History Joseph Pierini, LPN; 06/28/5571 2:20  AM) Alcohol use Occasional alcohol use. Caffeine use Coffee. No drug use Tobacco use Never smoker.  Family History Joseph Pierini, LPN; 06/29/4268 6:23 AM) Cancer Mother. Ischemic Bowel Disease Father. Thyroid problems Mother.  Pregnancy / Birth History Joseph Pierini, LPN; 11/27/2829 5:17 AM) Age at menarche 30 years. Gravida 1 Maternal age 14-30 Para 1 Regular periods  Review of Systems Jenny Reichmann Smithey LPN; 10/24/6071 7:10 AM) General Present- Fatigue. Not Present- Appetite Loss, Chills, Fever, Night Sweats, Weight Gain and Weight Loss. Skin Not Present- Change in Wart/Mole, Dryness, Hives, Jaundice, New Lesions, Non-Healing Wounds, Rash and Ulcer. HEENT Present- Wears glasses/contact lenses. Not Present- Earache, Hearing Loss, Hoarseness, Nose Bleed, Oral Ulcers, Ringing in the Ears, Seasonal Allergies, Sinus Pain, Sore Throat, Visual Disturbances and Yellow Eyes. Respiratory Not Present- Bloody sputum, Chronic Cough, Difficulty Breathing, Snoring and Wheezing. Breast Not Present- Breast Mass, Breast Pain, Nipple Discharge and Skin Changes. Cardiovascular Not Present- Chest Pain, Difficulty Breathing Lying Down, Leg Cramps, Palpitations, Rapid Heart Rate, Shortness of Breath and Swelling of Extremities. Gastrointestinal Not Present- Abdominal Pain, Bloating, Bloody Stool, Change in Bowel Habits, Chronic diarrhea, Constipation, Difficulty Swallowing, Excessive gas, Gets full quickly at meals, Hemorrhoids, Indigestion, Nausea, Rectal Pain and Vomiting. Female Genitourinary Not Present- Frequency, Nocturia, Painful Urination, Pelvic Pain and Urgency. Musculoskeletal Not Present- Back Pain, Joint Pain, Joint Stiffness, Muscle Pain, Muscle Weakness and Swelling of Extremities. Neurological Not Present- Decreased Memory, Fainting, Headaches, Numbness, Seizures, Tingling, Tremor, Trouble walking and Weakness. Psychiatric Not Present- Anxiety, Bipolar, Change in Sleep Pattern,  Depression, Fearful and Frequent crying. Endocrine Not Present- Cold Intolerance, Excessive Hunger, Hair Changes, Heat Intolerance, Hot flashes and New Diabetes. Hematology Not Present- Easy Bruising, Excessive bleeding, Gland problems, HIV and Persistent Infections.   Vitals Jenny Reichmann Smithey LPN; 10/25/6946 5:46 AM) 05/30/2014  9:45 AM Weight: 136 lb Height: 69in Body Surface Area: 1.73 m Body Mass Index: 20.08 kg/m Temp.: 98.73F(Oral)  Pulse: 66 (Regular)  Resp.: 18 (Unlabored)  BP: 120/80 (Sitting, Left Arm, Standard)    Physical Exam Earnstine Regal MD; 05/30/2014 10:22 AM)  General - appears comfortable, no distress; not diaphorectic  HEENT - normocephalic; sclerae clear, gaze conjugate; mucous membranes moist, dentition good; voice normal  Neck - asymmetric on extension; no palpable anterior or posterior cervical adenopathy; left thyroid lobe without palpable abnormality; right thyroid lobe has a firm dominant mass measuring at least 3 cm in size, mobile, nontender, slightly irregular; no supraclavicular lymphadenopathy palpable  Chest - clear bilaterally with rhonchi, rales, or wheeze  Cor - regular rhythm with normal rate; no significant murmur  Ext - non-tender without significant edema or lymphedema  Neuro - grossly intact; no tremor    Assessment & Plan  PAPILLARY THYROID CARCINOMA (193  C73)  Patient has papillary thyroid carcinoma, right thyroid lobe, 3.8 cm. I provided her with written literature regarding thyroid surgery to review at home.  I have recommended total thyroidectomy with limited central compartment lymph node dissection. Patient and I have discussed the procedure length. We have discussed risk and benefits including the risk of recurrent laryngeal nerve injury and injury to parathyroid glands. We have discussed the possibility of changes in vocal quality and focal range. We have discussed the surgical incision. We have discussed the hospital  stay and her postoperative recovery. We have discussed the need for lifelong thyroid hormone replacement. We have discussed the likely need for radioactive iodine treatment postoperatively. Patient understands all of these issues and wishes to proceed with surgery in the near future.  The risks and benefits of the procedure have been discussed at length with the patient. The patient understands the proposed procedure, potential alternative treatments, and the course of recovery to be expected. All of the patient's questions have been answered at this time. The patient wishes to proceed with surgery.  Earnstine Regal, MD, [facility] Office: [phone]

## 2014-06-18 ENCOUNTER — Observation Stay (HOSPITAL_COMMUNITY)
Admission: RE | Admit: 2014-06-18 | Discharge: 2014-06-19 | Disposition: A | Discharge status: Home / Self Care | Payer: BLUE CROSS/BLUE SHIELD | Source: Ambulatory Visit | Attending: Surgery | Admitting: Surgery

## 2014-06-18 ENCOUNTER — Ambulatory Visit (HOSPITAL_COMMUNITY): Payer: BLUE CROSS/BLUE SHIELD | Admitting: Registered Nurse

## 2014-06-18 ENCOUNTER — Encounter (HOSPITAL_COMMUNITY): Payer: Self-pay | Admitting: *Deleted

## 2014-06-18 ENCOUNTER — Encounter (HOSPITAL_COMMUNITY)
Admission: RE | Disposition: A | Discharge status: Home / Self Care | Payer: Self-pay | Source: Ambulatory Visit | Attending: Surgery

## 2014-06-18 DIAGNOSIS — G40909 Epilepsy, unspecified, not intractable, without status epilepticus: Secondary | ICD-10-CM | POA: Diagnosis not present

## 2014-06-18 DIAGNOSIS — C770 Secondary and unspecified malignant neoplasm of lymph nodes of head, face and neck: Secondary | ICD-10-CM | POA: Insufficient documentation

## 2014-06-18 DIAGNOSIS — C73 Malignant neoplasm of thyroid gland: Principal | ICD-10-CM | POA: Diagnosis present

## 2014-06-18 HISTORY — PX: THYROIDECTOMY: SHX17

## 2014-06-18 HISTORY — PX: NODE DISSECTION: SHX5269

## 2014-06-18 SURGERY — THYROIDECTOMY
Anesthesia: General | Site: Neck

## 2014-06-18 MED ORDER — SUCCINYLCHOLINE CHLORIDE 20 MG/ML IJ SOLN
INTRAMUSCULAR | Status: DC | PRN
  Administered 2014-06-18: 100 mg via INTRAVENOUS

## 2014-06-18 MED ORDER — LACTATED RINGERS IV SOLN
INTRAVENOUS | Status: DC
  Administered 2014-06-18: 1000 mL via INTRAVENOUS
  Administered 2014-06-18: 15:00:00 via INTRAVENOUS

## 2014-06-18 MED ORDER — ACETAMINOPHEN 10 MG/ML IV SOLN
1000.0000 mg | Freq: Once | INTRAVENOUS | Status: AC
  Administered 2014-06-18: 1000 mg via INTRAVENOUS
  Filled 2014-06-18: qty 100

## 2014-06-18 MED ORDER — CALCIUM CARBONATE 1250 (500 CA) MG PO TABS
2.0000 | ORAL_TABLET | Freq: Three times a day (TID) | ORAL | Status: DC
  Administered 2014-06-19: 1000 mg via ORAL
  Filled 2014-06-18 (×4): qty 2

## 2014-06-18 MED ORDER — KCL IN DEXTROSE-NACL 20-5-0.45 MEQ/L-%-% IV SOLN
INTRAVENOUS | Status: DC
  Administered 2014-06-18: 19:00:00 via INTRAVENOUS
  Filled 2014-06-18 (×2): qty 1000

## 2014-06-18 MED ORDER — ONDANSETRON HCL 4 MG/2ML IJ SOLN: 4.0000 mg | Freq: Four times a day (QID) | INTRAMUSCULAR | Status: DC | PRN

## 2014-06-18 MED ORDER — PROPOFOL 10 MG/ML IV BOLUS
INTRAVENOUS | Status: DC | PRN
  Administered 2014-06-18: 200 mg via INTRAVENOUS

## 2014-06-18 MED ORDER — GLYCOPYRROLATE 0.2 MG/ML IJ SOLN
INTRAMUSCULAR | Status: AC
  Filled 2014-06-18: qty 2

## 2014-06-18 MED ORDER — DEXAMETHASONE SODIUM PHOSPHATE 10 MG/ML IJ SOLN
INTRAMUSCULAR | Status: AC
  Filled 2014-06-18: qty 1

## 2014-06-18 MED ORDER — FENTANYL CITRATE 0.05 MG/ML IJ SOLN
INTRAMUSCULAR | Status: AC
  Filled 2014-06-18: qty 5

## 2014-06-18 MED ORDER — CEFAZOLIN SODIUM-DEXTROSE 2-3 GM-% IV SOLR
2.0000 g | INTRAVENOUS | Status: AC
  Administered 2014-06-18: 2 g via INTRAVENOUS

## 2014-06-18 MED ORDER — MIDAZOLAM HCL 2 MG/2ML IJ SOLN
INTRAMUSCULAR | Status: AC
  Filled 2014-06-18: qty 2

## 2014-06-18 MED ORDER — SCOPOLAMINE 1 MG/3DAYS TD PT72
MEDICATED_PATCH | TRANSDERMAL | Status: DC | PRN
  Administered 2014-06-18: 1 via TRANSDERMAL

## 2014-06-18 MED ORDER — LIDOCAINE HCL (CARDIAC) 20 MG/ML IV SOLN
INTRAVENOUS | Status: DC | PRN
  Administered 2014-06-18: 25 mg via INTRATRACHEAL
  Administered 2014-06-18: 75 mg via INTRAVENOUS

## 2014-06-18 MED ORDER — NEOSTIGMINE METHYLSULFATE 10 MG/10ML IV SOLN
INTRAVENOUS | Status: AC
  Filled 2014-06-18: qty 1

## 2014-06-18 MED ORDER — 0.9 % SODIUM CHLORIDE (POUR BTL) OPTIME
TOPICAL | Status: DC | PRN
  Administered 2014-06-18: 1000 mL

## 2014-06-18 MED ORDER — HYDROMORPHONE HCL 2 MG/ML IJ SOLN
INTRAMUSCULAR | Status: AC
  Filled 2014-06-18: qty 1

## 2014-06-18 MED ORDER — EPHEDRINE SULFATE 50 MG/ML IJ SOLN
INTRAMUSCULAR | Status: AC
  Filled 2014-06-18: qty 1

## 2014-06-18 MED ORDER — GLYCOPYRROLATE 0.2 MG/ML IJ SOLN
INTRAMUSCULAR | Status: DC | PRN
  Administered 2014-06-18: 0.4 mg via INTRAVENOUS

## 2014-06-18 MED ORDER — HYDROCODONE-ACETAMINOPHEN 5-325 MG PO TABS
1.0000 | ORAL_TABLET | ORAL | Status: DC | PRN
  Administered 2014-06-18 (×2): 1 via ORAL
  Administered 2014-06-19: 2 via ORAL
  Filled 2014-06-18 (×2): qty 1
  Filled 2014-06-18: qty 2

## 2014-06-18 MED ORDER — NEOSTIGMINE METHYLSULFATE 10 MG/10ML IV SOLN
INTRAVENOUS | Status: DC | PRN
  Administered 2014-06-18: 3 mg via INTRAVENOUS

## 2014-06-18 MED ORDER — DEXAMETHASONE SODIUM PHOSPHATE 10 MG/ML IJ SOLN
INTRAMUSCULAR | Status: DC | PRN
  Administered 2014-06-18: 10 mg via INTRAVENOUS

## 2014-06-18 MED ORDER — METOCLOPRAMIDE HCL 5 MG/ML IJ SOLN
INTRAMUSCULAR | Status: AC
  Filled 2014-06-18: qty 2

## 2014-06-18 MED ORDER — PROPOFOL 10 MG/ML IV BOLUS
INTRAVENOUS | Status: AC
  Filled 2014-06-18: qty 20

## 2014-06-18 MED ORDER — FENTANYL CITRATE 0.05 MG/ML IJ SOLN
INTRAMUSCULAR | Status: AC
  Filled 2014-06-18: qty 2

## 2014-06-18 MED ORDER — LACTATED RINGERS IV SOLN: INTRAVENOUS | Status: DC

## 2014-06-18 MED ORDER — LIDOCAINE HCL (CARDIAC) 20 MG/ML IV SOLN
INTRAVENOUS | Status: AC
  Filled 2014-06-18: qty 5

## 2014-06-18 MED ORDER — METOCLOPRAMIDE HCL 5 MG/ML IJ SOLN
INTRAMUSCULAR | Status: DC | PRN
  Administered 2014-06-18: 10 mg via INTRAVENOUS

## 2014-06-18 MED ORDER — ONDANSETRON HCL 4 MG/2ML IJ SOLN
INTRAMUSCULAR | Status: DC | PRN
Start: 2014-06-18 — End: 2014-06-18
  Administered 2014-06-18: 4 mg via INTRAVENOUS

## 2014-06-18 MED ORDER — ACETAMINOPHEN 325 MG PO TABS
650.0000 mg | ORAL_TABLET | ORAL | Status: DC | PRN
Start: 2014-06-18 — End: 2014-06-19

## 2014-06-18 MED ORDER — SODIUM CHLORIDE 0.9 % IJ SOLN
INTRAMUSCULAR | Status: AC
  Filled 2014-06-18: qty 10

## 2014-06-18 MED ORDER — ATROPINE SULFATE 0.4 MG/ML IJ SOLN
INTRAMUSCULAR | Status: AC
  Filled 2014-06-18: qty 1

## 2014-06-18 MED ORDER — SCOPOLAMINE 1 MG/3DAYS TD PT72
MEDICATED_PATCH | TRANSDERMAL | Status: AC
  Filled 2014-06-18: qty 1

## 2014-06-18 MED ORDER — FENTANYL CITRATE 0.05 MG/ML IJ SOLN
INTRAMUSCULAR | Status: DC | PRN
  Administered 2014-06-18: 100 ug via INTRAVENOUS
  Administered 2014-06-18: 50 ug via INTRAVENOUS
  Administered 2014-06-18: 100 ug via INTRAVENOUS

## 2014-06-18 MED ORDER — ROCURONIUM BROMIDE 100 MG/10ML IV SOLN
INTRAVENOUS | Status: DC | PRN
  Administered 2014-06-18: 5 mg via INTRAVENOUS
  Administered 2014-06-18: 35 mg via INTRAVENOUS
  Administered 2014-06-18: 10 mg via INTRAVENOUS

## 2014-06-18 MED ORDER — HYDROMORPHONE HCL 1 MG/ML IJ SOLN: 1.0000 mg | INTRAMUSCULAR | Status: DC | PRN

## 2014-06-18 MED ORDER — FENTANYL CITRATE 0.05 MG/ML IJ SOLN
25.0000 ug | INTRAMUSCULAR | Status: DC | PRN
  Administered 2014-06-18 (×2): 50 ug via INTRAVENOUS

## 2014-06-18 MED ORDER — PROMETHAZINE HCL 25 MG/ML IJ SOLN
6.2500 mg | INTRAMUSCULAR | Status: DC | PRN
Start: 2014-06-18 — End: 2014-06-18

## 2014-06-18 MED ORDER — ONDANSETRON HCL 4 MG/2ML IJ SOLN
INTRAMUSCULAR | Status: AC
  Filled 2014-06-18: qty 2

## 2014-06-18 MED ORDER — MEPERIDINE HCL 50 MG/ML IJ SOLN: 6.2500 mg | INTRAMUSCULAR | Status: DC | PRN

## 2014-06-18 MED ORDER — ONDANSETRON HCL 4 MG PO TABS: 4.0000 mg | ORAL_TABLET | Freq: Four times a day (QID) | ORAL | Status: DC | PRN

## 2014-06-18 MED ORDER — CEFAZOLIN SODIUM-DEXTROSE 2-3 GM-% IV SOLR
INTRAVENOUS | Status: AC
  Filled 2014-06-18: qty 50

## 2014-06-18 MED ORDER — HYDROMORPHONE HCL 1 MG/ML IJ SOLN
INTRAMUSCULAR | Status: DC | PRN
  Administered 2014-06-18 (×2): 0.5 mg via INTRAVENOUS

## 2014-06-18 MED ORDER — MIDAZOLAM HCL 5 MG/5ML IJ SOLN
INTRAMUSCULAR | Status: DC | PRN
  Administered 2014-06-18: 2 mg via INTRAVENOUS

## 2014-06-18 SURGICAL SUPPLY — 42 items
ATTRACTOMAT 16X20 MAGNETIC DRP (DRAPES) ×2 IMPLANT
BENZOIN TINCTURE PRP APPL 2/3 (GAUZE/BANDAGES/DRESSINGS) ×2 IMPLANT
BLADE HEX COATED 2.75 (ELECTRODE) ×2 IMPLANT
BLADE SURG 15 STRL LF DISP TIS (BLADE) ×1 IMPLANT
BLADE SURG 15 STRL SS (BLADE) ×1
CHLORAPREP W/TINT 26ML (MISCELLANEOUS) ×2 IMPLANT
CLIP TI MEDIUM 6 (CLIP) ×6 IMPLANT
CLIP TI WIDE RED SMALL 6 (CLIP) ×8 IMPLANT
CLOSURE STERI-STRIP 1/4X4 (GAUZE/BANDAGES/DRESSINGS) ×2 IMPLANT
DISSECTOR ROUND CHERRY 3/8 STR (MISCELLANEOUS) IMPLANT
DRAPE LAPAROTOMY T 98X78 PEDS (DRAPES) ×2 IMPLANT
DRESSING SURGICEL FIBRLLR 1X2 (HEMOSTASIS) ×1 IMPLANT
DRSG SURGICEL FIBRILLAR 1X2 (HEMOSTASIS) ×2
ELECT REM PT RETURN 9FT ADLT (ELECTROSURGICAL) ×2
ELECTRODE REM PT RTRN 9FT ADLT (ELECTROSURGICAL) ×1 IMPLANT
GAUZE SPONGE 4X4 12PLY STRL (GAUZE/BANDAGES/DRESSINGS) ×2 IMPLANT
GAUZE SPONGE 4X4 16PLY XRAY LF (GAUZE/BANDAGES/DRESSINGS) ×2 IMPLANT
GLOVE BIOGEL PI IND STRL 7.0 (GLOVE) ×1 IMPLANT
GLOVE BIOGEL PI INDICATOR 7.0 (GLOVE) ×1
GLOVE SURG ORTHO 8.0 STRL STRW (GLOVE) ×2 IMPLANT
GLOVE SURG SIGNA 7.5 PF LTX (GLOVE) ×2 IMPLANT
GLOVE SURG SS PI 6.5 STRL IVOR (GLOVE) ×2 IMPLANT
GOWN STRL REUS W/TWL XL LVL3 (GOWN DISPOSABLE) ×6 IMPLANT
KIT BASIN OR (CUSTOM PROCEDURE TRAY) ×2 IMPLANT
LIQUID BAND (GAUZE/BANDAGES/DRESSINGS) ×2 IMPLANT
NS IRRIG 1000ML POUR BTL (IV SOLUTION) ×2 IMPLANT
PACK BASIC VI WITH GOWN DISP (CUSTOM PROCEDURE TRAY) ×2 IMPLANT
PENCIL BUTTON HOLSTER BLD 10FT (ELECTRODE) ×2 IMPLANT
SHEARS HARMONIC 9CM CVD (BLADE) ×2 IMPLANT
STAPLER VISISTAT 35W (STAPLE) IMPLANT
STRIP CLOSURE SKIN 1/2X4 (GAUZE/BANDAGES/DRESSINGS) ×2 IMPLANT
SUT MNCRL AB 4-0 PS2 18 (SUTURE) ×2 IMPLANT
SUT SILK 2 0 (SUTURE)
SUT SILK 2-0 18XBRD TIE 12 (SUTURE) IMPLANT
SUT SILK 3 0 (SUTURE)
SUT SILK 3-0 18XBRD TIE 12 (SUTURE) IMPLANT
SUT VIC AB 3-0 SH 18 (SUTURE) ×4 IMPLANT
SYR BULB IRRIGATION 50ML (SYRINGE) ×2 IMPLANT
TAPE CLOTH SURG 6X10 WHT LF (GAUZE/BANDAGES/DRESSINGS) ×2 IMPLANT
TOWEL OR 17X26 10 PK STRL BLUE (TOWEL DISPOSABLE) ×2 IMPLANT
TOWEL OR NON WOVEN STRL DISP B (DISPOSABLE) ×2 IMPLANT
YANKAUER SUCT BULB TIP 10FT TU (MISCELLANEOUS) ×2 IMPLANT

## 2014-06-18 NOTE — Anesthesia Postprocedure Evaluation (Signed)
  Anesthesia Post-op Note  Patient: Krystal Jefferson  Procedure(s) Performed: Procedure(s) (LRB): TOTAL THYROIDECTOMY WITH LIMITED NODE DISSECTION (N/A) LIMITED NODE DISSECTION (N/A)  Patient Location: PACU  Anesthesia Type: General  Level of Consciousness: awake and alert   Airway and Oxygen Therapy: Patient Spontanous Breathing  Post-op Pain: mild  Post-op Assessment: Post-op Vital signs reviewed, Patient's Cardiovascular Status Stable, Respiratory Function Stable, Patent Airway and No signs of Nausea or vomiting  Last Vitals:  Filed Vitals:   06/18/14 1700  BP: 145/79  Pulse: 71  Temp:   Resp: 14    Post-op Vital Signs: stable   Complications: No apparent anesthesia complications

## 2014-06-18 NOTE — Brief Op Note (Signed)
06/18/2014  3:46 PM  PATIENT:  [name]  29 y.o. female  PRE-OPERATIVE DIAGNOSIS:  PAPILLARY THROID CARCINOMA  POST-OPERATIVE DIAGNOSIS:  PAPILLARY THROID CARCINOMA  PROCEDURE:  Procedure(s): TOTAL THYROIDECTOMY WITH LIMITED NODE DISSECTION (N/A) LIMITED NODE DISSECTION (N/A)  SURGEON:  Surgeon(s) and Role:    * Alphonsa Overall, MD - Assisting    * Armandina Gemma, MD - Primary  ANESTHESIA:   general  EBL:  Total I/O In: 1000 [I.V.:1000] Out: -   BLOOD ADMINISTERED:none  DRAINS: none   LOCAL MEDICATIONS USED:  NONE  SPECIMEN:  Excision  DISPOSITION OF SPECIMEN:  PATHOLOGY  COUNTS:  YES  TOURNIQUET:  * No tourniquets in log *  DICTATION: .Other Dictation: Dictation Number [id number]  PLAN OF CARE: Admit for overnight observation  PATIENT DISPOSITION:  PACU - hemodynamically stable.   Delay start of Pharmacological VTE agent (>24hrs) due to surgical blood loss or risk of bleeding: yes  Earnstine Regal, MD, [facility] Office: [phone]

## 2014-06-18 NOTE — Interval H&P Note (Signed)
History and Physical Interval Note:  06/18/2014 1:42 PM  Krystal Jefferson  has presented today for surgery, with the diagnosis of PAPILLARY THROID CARCINOMA.  The various methods of treatment have been discussed with the patient and family. After consideration of risks, benefits and other options for treatment, the patient has consented to    Procedure(s): TOTAL THYROIDECTOMY WITH LIMITED NODE DISSECTION (N/A) LIMITED NODE DISSECTION (N/A) as a surgical intervention .    The patient's history has been reviewed, patient examined, no change in status, stable for surgery.  I have reviewed the patient's chart and labs.  Questions were answered to the patient's satisfaction.    Earnstine Regal, MD, [facility] Office: [place]

## 2014-06-18 NOTE — Transfer of Care (Signed)
Immediate Anesthesia Transfer of Care Note  Patient: Krystal Jefferson  Procedure(s) Performed: Procedure(s) (LRB): TOTAL THYROIDECTOMY WITH LIMITED NODE DISSECTION (N/A) LIMITED NODE DISSECTION (N/A)  Patient Location: PACU  Anesthesia Type: General  Level of Consciousness: sedated, patient cooperative and responds to stimulation  Airway & Oxygen Therapy: Patient Spontanous Breathing and Patient connected to face mask oxgen  Post-op Assessment: Report given to PACU RN and Post -op Vital signs reviewed and stable  Post vital signs: Reviewed and stable  Complications: No apparent anesthesia complications

## 2014-06-18 NOTE — Anesthesia Preprocedure Evaluation (Addendum)
Anesthesia Evaluation  Patient identified by MRN, date of birth, ID band Patient awake    Reviewed: Allergy & Precautions, NPO status , Patient's Chart, lab work & pertinent test results  Airway Mallampati: II  TM Distance: >3 FB Neck ROM: Full    Dental no notable dental hx.    Pulmonary neg pulmonary ROS,  breath sounds clear to auscultation  Pulmonary exam normal       Cardiovascular negative cardio ROS  Rhythm:Regular Rate:Normal     Neuro/Psych Seizures -, Well Controlled,  negative neurological ROS  negative psych ROS   GI/Hepatic negative GI ROS, Neg liver ROS,   Endo/Other  negative endocrine ROS  Renal/GU negative Renal ROS  negative genitourinary   Musculoskeletal negative musculoskeletal ROS (+)   Abdominal   Peds negative pediatric ROS (+)  Hematology negative hematology ROS (+)   Anesthesia Other Findings   Reproductive/Obstetrics negative OB ROS                            Anesthesia Physical Anesthesia Plan  ASA: II  Anesthesia Plan: General   Post-op Pain Management:    Induction: Intravenous  Airway Management Planned: Oral ETT  Additional Equipment:   Intra-op Plan:   Post-operative Plan: Extubation in OR  Informed Consent: I have reviewed the patients History and Physical, chart, labs and discussed the procedure including the risks, benefits and alternatives for the proposed anesthesia with the patient or authorized representative who has indicated his/her understanding and acceptance.   Dental advisory given  Plan Discussed with: CRNA  Anesthesia Plan Comments:         Anesthesia Quick Evaluation

## 2014-06-19 ENCOUNTER — Encounter (HOSPITAL_COMMUNITY): Payer: Self-pay | Admitting: Surgery

## 2014-06-19 DIAGNOSIS — C73 Malignant neoplasm of thyroid gland: Secondary | ICD-10-CM | POA: Diagnosis not present

## 2014-06-19 LAB — BASIC METABOLIC PANEL
Anion gap: 6 (ref 5–15)
BUN: 9 mg/dL (ref 6–23)
CALCIUM: 7.9 mg/dL — AB (ref 8.4–10.5)
CO2: 27 mmol/L (ref 19–32)
CREATININE: 0.56 mg/dL (ref 0.50–1.10)
Chloride: 105 mmol/L (ref 96–112)
GFR calc Af Amer: >90 mL/min (ref >90)
GFR calc non Af Amer: >90 mL/min (ref >90)
Glucose, Bld: 124 mg/dL — ABNORMAL HIGH (ref 70–99)
Potassium: 4.5 mmol/L (ref 3.5–5.1)
SODIUM: 138 mmol/L (ref 135–145)

## 2014-06-19 MED ORDER — SYNTHROID 100 MCG PO TABS: 100.0000 ug | ORAL_TABLET | Freq: Every day | ORAL | Status: DC

## 2014-06-19 MED ORDER — SODIUM CHLORIDE 0.9 % IV SOLN
2.0000 g | INTRAVENOUS | Status: AC
  Administered 2014-06-19: 2 g via INTRAVENOUS
  Filled 2014-06-19: qty 20

## 2014-06-19 MED ORDER — CALCIUM CARBONATE 1250 (500 CA) MG PO TABS: 2.0000 | ORAL_TABLET | Freq: Three times a day (TID) | ORAL | Status: DC

## 2014-06-19 MED ORDER — OXYCODONE HCL 5 MG PO TABS: 5.0000 mg | ORAL_TABLET | ORAL | Status: DC | PRN

## 2014-06-19 NOTE — Discharge Summary (Signed)
  Physician Discharge Summary [facility]  Patient ID: Krystal Jefferson MRN: [id number] DOB/AGE: 09-23-1985 29 y.o.  Admit date: 06/18/2014 Discharge date: 06/19/2014  Admission Diagnoses:  Papillary thyroid cancer  Discharge Diagnoses:  Principal Problem:   Papillary thyroid carcinoma Active Problems:   Thyroid cancer   Discharged Condition: good  Hospital Course: Patient was admitted for observation following thyroid surgery.  Post op course was uncomplicated.  Pain was well controlled.  Tolerated diet.  Post op calcium level on morning following surgery was 7.9 mg/dl.  Intravenous calcium gluconate 2gm was given prior to discharge home.  Patient was prepared for discharge home on POD#1.  Consults: None  Treatments: surgery: total thyroidectomy with limited lymph node dissection  Discharge Exam: Blood pressure 123/84, pulse 55, temperature 98.2 F (36.8 C), temperature source Oral, resp. rate 18, height 5\' 9"  (1.753 m), weight 136 lb (61.689 kg), SpO2 99 %, not currently breastfeeding. HEENT - clear Neck - wound clear and dry with minimal soft tissue swelling; voice normal Chest - clear bilaterally Cor - RRR  Disposition: Home     Medication List    ASK your doctor about these medications        ibuprofen 200 MG tablet  Commonly known as:  ADVIL,MOTRIN  Take 200-600 mg by mouth every 6 (six) hours as needed (Pain).     ibuprofen 600 MG tablet  Commonly known as:  ADVIL,MOTRIN  Take 1 tablet (600 mg total) by mouth every 6 (six) hours as needed for pain.           Follow-up Information    Follow up with Earnstine Regal, MD. Schedule an appointment as soon as possible for a visit in 3 weeks.   Specialty:  General Surgery   Why:  For wound re-check   Contact information:   [place] [address] [phone]       Earnstine Regal, MD, [facility] Office:  [phone]   Signed: Earnstine Regal 06/19/2014, 7:20 AM

## 2014-06-19 NOTE — Op Note (Signed)
Krystal Jefferson, Krystal Jefferson             ACCOUNT NO.:  [id number]  MEDICAL RECORD NO.:  [id number]  LOCATION:  1601                         FACILITY:  [facility]  PHYSICIAN:  Earnstine Regal, MD      DATE OF BIRTH:  1985/05/29  DATE OF PROCEDURE:  06/18/2014                              OPERATIVE REPORT   PREOPERATIVE DIAGNOSIS:  Papillary thyroid carcinoma.  POSTOPERATIVE DIAGNOSIS:  Papillary thyroid carcinoma.  PROCEDURE: 1. Total thyroidectomy. 2. Central compartment lymph node dissection.  SURGEON:  Earnstine Regal, MD, FACS  ASSISTANT:  Fenton Malling. Lucia Gaskins, MD, FACS  ANESTHESIA:  General.  ESTIMATED BLOOD LOSS:  Minimal.  PREPARATION:  ChloraPrep.  COMPLICATIONS:  None.  INDICATIONS:  The patient is a 29 year old female referred by her endocrinologist, Dr. Legrand Como Altheimer, for newly diagnosed papillary thyroid carcinoma.  The patient had been found on routine physical examination by her gynecologist to have a palpable thyroid nodule. Ultrasound in October 2015 showed a solitary 3.8 cm mass in the right thyroid lobe.  This contained microcalcifications.  Subsequent fine- needle aspiration biopsy was performed and cytopathology was consistent with papillary thyroid carcinoma.  The patient is now referred for thyroidectomy and lymph node sampling.  BODY OF REPORT:  Procedure was done in OR #11 at the [facility].  The patient was brought to the operating room, placed in a supine position on the operating room table.  Following administration of general anesthesia, the patient was positioned and then prepped and draped in the usual aseptic fashion.  After ascertaining that an adequate level of anesthesia had been achieved, a Kocher incision was made with a #15 blade.  Dissection was carried through subcutaneous tissues and platysma.  Hemostasis was achieved with the electrocautery.  Skin flaps were elevated cephalad and caudad from the thyroid notch to the sternal  notch.  Mahorner self-retaining retractor was placed for exposure.  Strap muscles were incised in the midline, dissection was begun on the left side.  Left thyroid lobe was slightly firm.  There were no dominant masses.  There was no obvious lymphadenopathy.  Left lobe was gently mobilized with blunt dissection. Venous tributaries were divided between Ligaclips with the Harmonic scalpel.  Superior pole vessels were dissected out individually and divided between small and medium Ligaclips with the Harmonic scalpel. Inferior venous tributaries were divided between Ligaclips with the Harmonic scalpel.  Gland was rolled anteriorly.  Superior parathyroid gland was identified and preserved.  Branches of the inferior thyroid artery were divided between small Ligaclips.  Recurrent laryngeal nerve was identified and preserved.  Ligament of Gwenlyn Found was released with the electrocautery and the lobe was mobilized onto the anterior trachea. There was a moderate-sized pyramidal lobe, which was dissected off the anterior thyroid cartilage and resected with the isthmus.  Dry pack was placed in the left neck.  Next, we turned our attention to the right thyroid lobe.  Strap muscles were again reflected laterally.  There was a palpable dominant mass in the inferior and mid portion of the right thyroid lobe.  Right lobe was gently mobilized with blunt dissection.  Superior pole vessels were dissected out and divided between medium Ligaclips with the Harmonic scalpel.  Superior parathyroid gland was identified and preserved. Inferior venous tributaries were divided between Ligaclips with the Harmonic scalpel.  There appeared to be obvious involved lymph nodes in the central compartment.  These were left in situ for the time being. Branches of the inferior thyroid artery were divided between small Ligaclips with the Harmonic scalpel.  Recurrent laryngeal nerve was identified and preserved.  Gland was rolled  anteriorly and ligament of Gwenlyn Found was divided with the electrocautery and the gland was rolled onto the anterior trachea.  It was resected with the Harmonic scalpel. Suture was used to mark the right superior pole.  The entire thyroid gland was submitted to Pathology for review.  Next, the central compartment lymph nodes were mobilized.  Care was taken to avoid the recurrent nerves bilaterally.  There was a prominent horn of the thymus gland on the right.  This was partially resected with lymph nodes.  Lymph nodes were clustered in a group of approximately 4 with dark discoloration.  They were resected en bloc.  There was also some thymic tissue noted on the left side of the neck and it was partially resected with the central compartment lymph nodes.  Venous tributaries were divided between Ligaclips with the Harmonic scalpel and the entire specimen was removed.  It was submitted to Pathology, labeled as central compartment lymph nodes.  Neck was irrigated with warm saline and good hemostasis was noted throughout.  Further palpation bilaterally revealed no further lymphadenopathy.  Fibrillar was placed throughout the operative field. Strap muscles were reapproximated in the midline with interrupted 3-0 Vicryl sutures.  Platysma was closed with interrupted 3-0 Vicryl sutures.  Skin was closed with a running 4-0 Monocryl subcuticular suture.  Wound was washed and dried and Dermabond was applied to the skin.  The patient was awakened from anesthesia and brought to the recovery room in stable condition.  The patient tolerated the procedure well.   Earnstine Regal, MD, [facility] Office: [phone]   TMG/MEDQ  D:  06/18/2014  T:  06/19/2014  Job:  [id number]  cc:   Juanda Bond. Altheimer, M.D. Fax: [place], [place] [phone] [address] [place] [address]  Servando Salina, M.D. Fax: [phone]

## 2014-06-19 NOTE — Progress Notes (Signed)
Patient is alert and oriented, vital signs are stable, incision to neck is well approximated and clean;dry and intact, patient is tolerating diet without complaints of nausea or vomiting, discharge instructions reviewed with patient and questions and concerns answered, patient to follow up with MD Neta Mends RN 06-19-2014 10:11am

## 2014-06-19 NOTE — Discharge Instructions (Signed)

## 2014-06-22 ENCOUNTER — Other Ambulatory Visit: Payer: Self-pay | Admitting: Surgery

## 2014-06-22 LAB — CALCIUM: Calcium: 10.3 mg/dL (ref 8.4–10.5)

## 2014-07-07 ENCOUNTER — Other Ambulatory Visit: Payer: Self-pay | Admitting: Endocrinology

## 2014-07-07 DIAGNOSIS — C73 Malignant neoplasm of thyroid gland: Secondary | ICD-10-CM

## 2014-08-06 ENCOUNTER — Encounter (HOSPITAL_COMMUNITY)
Admission: RE | Admit: 2014-08-06 | Discharge: 2014-08-06 | Disposition: A | Discharge status: Home / Self Care | Payer: BLUE CROSS/BLUE SHIELD | Source: Ambulatory Visit | Attending: Endocrinology | Admitting: Endocrinology

## 2014-08-06 DIAGNOSIS — C73 Malignant neoplasm of thyroid gland: Secondary | ICD-10-CM | POA: Insufficient documentation

## 2014-08-06 MED ORDER — THYROTROPIN ALFA 1.1 MG IM SOLR
0.9000 mg | INTRAMUSCULAR | Status: AC
  Administered 2014-08-06: 0.9 mg via INTRAMUSCULAR
  Filled 2014-08-06: qty 0.9

## 2014-08-07 ENCOUNTER — Encounter (HOSPITAL_COMMUNITY)
Admission: RE | Admit: 2014-08-07 | Discharge: 2014-08-07 | Disposition: A | Discharge status: Home / Self Care | Payer: BLUE CROSS/BLUE SHIELD | Source: Ambulatory Visit | Attending: Endocrinology | Admitting: Endocrinology

## 2014-08-07 DIAGNOSIS — C73 Malignant neoplasm of thyroid gland: Secondary | ICD-10-CM | POA: Diagnosis not present

## 2014-08-07 MED ORDER — THYROTROPIN ALFA 1.1 MG IM SOLR
0.9000 mg | INTRAMUSCULAR | Status: AC
  Administered 2014-08-07: 0.9 mg via INTRAMUSCULAR
  Filled 2014-08-07: qty 0.9

## 2014-08-08 ENCOUNTER — Encounter (HOSPITAL_COMMUNITY)
Admission: RE | Admit: 2014-08-08 | Discharge: 2014-08-08 | Disposition: A | Discharge status: Home / Self Care | Payer: BLUE CROSS/BLUE SHIELD | Source: Ambulatory Visit | Attending: Endocrinology | Admitting: Endocrinology

## 2014-08-08 DIAGNOSIS — C73 Malignant neoplasm of thyroid gland: Secondary | ICD-10-CM | POA: Diagnosis not present

## 2014-08-08 LAB — HCG, SERUM, QUALITATIVE: PREG SERUM: NEGATIVE

## 2014-08-08 MED ORDER — SODIUM IODIDE I 131 CAPSULE
99.2000 | Freq: Once | INTRAVENOUS | Status: AC | PRN
  Administered 2014-08-08: 99.2 via ORAL

## 2014-08-15 ENCOUNTER — Encounter (HOSPITAL_COMMUNITY)
Admission: RE | Admit: 2014-08-15 | Discharge: 2014-08-15 | Disposition: A | Discharge status: Home / Self Care | Payer: BLUE CROSS/BLUE SHIELD | Source: Ambulatory Visit | Attending: Endocrinology | Admitting: Endocrinology

## 2014-08-15 DIAGNOSIS — C73 Malignant neoplasm of thyroid gland: Secondary | ICD-10-CM | POA: Insufficient documentation

## 2015-01-01 ENCOUNTER — Other Ambulatory Visit (HOSPITAL_COMMUNITY): Payer: Self-pay | Admitting: Endocrinology

## 2015-01-01 ENCOUNTER — Other Ambulatory Visit: Payer: Self-pay | Admitting: Endocrinology

## 2015-01-01 DIAGNOSIS — C73 Malignant neoplasm of thyroid gland: Secondary | ICD-10-CM

## 2015-02-04 ENCOUNTER — Encounter (HOSPITAL_COMMUNITY)
Admission: RE | Admit: 2015-02-04 | Discharge: 2015-02-04 | Disposition: A | Discharge status: Home / Self Care | Payer: BLUE CROSS/BLUE SHIELD | Source: Ambulatory Visit | Attending: Endocrinology | Admitting: Endocrinology

## 2015-02-04 DIAGNOSIS — C73 Malignant neoplasm of thyroid gland: Secondary | ICD-10-CM | POA: Diagnosis present

## 2015-02-04 MED ORDER — THYROTROPIN ALFA 1.1 MG IM SOLR
0.9000 mg | INTRAMUSCULAR | Status: AC
  Administered 2015-02-04: 0.9 mg via INTRAMUSCULAR

## 2015-02-05 ENCOUNTER — Encounter (HOSPITAL_COMMUNITY)
Admission: RE | Admit: 2015-02-05 | Discharge: 2015-02-05 | Disposition: A | Discharge status: Home / Self Care | Payer: BLUE CROSS/BLUE SHIELD | Source: Ambulatory Visit | Attending: Endocrinology | Admitting: Endocrinology

## 2015-02-05 DIAGNOSIS — C73 Malignant neoplasm of thyroid gland: Secondary | ICD-10-CM | POA: Diagnosis not present

## 2015-02-05 MED ORDER — STERILE WATER FOR INJECTION IJ SOLN
INTRAMUSCULAR | Status: AC
Start: 2015-02-05 — End: 2015-02-05
  Administered 2015-02-05: 10 mL
  Filled 2015-02-05: qty 10

## 2015-02-05 MED ORDER — THYROTROPIN ALFA 1.1 MG IM SOLR
0.9000 mg | INTRAMUSCULAR | Status: AC
  Administered 2015-02-05: 0.9 mg via INTRAMUSCULAR

## 2015-02-06 ENCOUNTER — Encounter (HOSPITAL_COMMUNITY)
Admission: RE | Admit: 2015-02-06 | Discharge: 2015-02-06 | Disposition: A | Discharge status: Home / Self Care | Payer: BLUE CROSS/BLUE SHIELD | Source: Ambulatory Visit | Attending: Endocrinology | Admitting: Endocrinology

## 2015-02-06 DIAGNOSIS — C73 Malignant neoplasm of thyroid gland: Secondary | ICD-10-CM | POA: Diagnosis not present

## 2015-02-06 LAB — HCG, SERUM, QUALITATIVE: Preg, Serum: NEGATIVE

## 2015-02-08 ENCOUNTER — Encounter (HOSPITAL_COMMUNITY)
Admission: RE | Admit: 2015-02-08 | Discharge: 2015-02-08 | Disposition: A | Discharge status: Home / Self Care | Payer: BLUE CROSS/BLUE SHIELD | Source: Ambulatory Visit | Attending: Endocrinology | Admitting: Endocrinology

## 2015-02-08 DIAGNOSIS — C73 Malignant neoplasm of thyroid gland: Secondary | ICD-10-CM | POA: Diagnosis not present

## 2015-02-08 MED ORDER — SODIUM IODIDE I 131 CAPSULE: 4.0000 | Freq: Once | INTRAVENOUS | Status: DC | PRN

## 2015-02-09 LAB — THYROGLOBULIN ANTIBODY

## 2015-02-14 LAB — THYROGLOBULIN LEVEL

## 2015-04-16 LAB — OB RESULTS CONSOLE GC/CHLAMYDIA
CHLAMYDIA, DNA PROBE: NEGATIVE
GC PROBE AMP, GENITAL: NEGATIVE

## 2015-04-16 LAB — OB RESULTS CONSOLE HIV ANTIBODY (ROUTINE TESTING): HIV: NONREACTIVE

## 2015-05-26 NOTE — L&D Delivery Note (Signed)
Delivery Note At 5:27 PM a viable and healthy female was delivered via Vaginal, Spontaneous Delivery (Presentation: Right Occiput Anterior).  APGAR: 8, 9; weight  .   Placenta status: Intact, Spontaneous.  Sent for (+) suboptimal GBS cx Cord: 3 vessels with the following complications: None.  Cord pH: none  Anesthesia: Epidural  Episiotomy: None Lacerations: Labial minora( right) Suture Repair: 3.0 chromic Est. Blood Loss (mL): 300  Mom to postpartum.  Baby to Couplet care / Skin to Skin.  Larena Ohnemus A 11/04/2015, 6:08 PM

## 2015-05-31 LAB — OB RESULTS CONSOLE GBS: GBS: POSITIVE

## 2015-08-23 LAB — OB RESULTS CONSOLE RPR: RPR: NONREACTIVE

## 2015-11-04 ENCOUNTER — Inpatient Hospital Stay (HOSPITAL_COMMUNITY): Payer: BLUE CROSS/BLUE SHIELD | Admitting: Anesthesiology

## 2015-11-04 ENCOUNTER — Encounter (HOSPITAL_COMMUNITY): Payer: Self-pay | Admitting: Obstetrics

## 2015-11-04 ENCOUNTER — Inpatient Hospital Stay (HOSPITAL_COMMUNITY)
Admission: AD | Admit: 2015-11-04 | Discharge: 2015-11-06 | DRG: 775 | Disposition: A | Discharge status: Home / Self Care | Payer: BLUE CROSS/BLUE SHIELD | Source: Ambulatory Visit | Attending: Obstetrics and Gynecology | Admitting: Obstetrics and Gynecology

## 2015-11-04 DIAGNOSIS — O99824 Streptococcus B carrier state complicating childbirth: Secondary | ICD-10-CM | POA: Diagnosis present

## 2015-11-04 DIAGNOSIS — Z3A39 39 weeks gestation of pregnancy: Secondary | ICD-10-CM

## 2015-11-04 DIAGNOSIS — Z85850 Personal history of malignant neoplasm of thyroid: Secondary | ICD-10-CM

## 2015-11-04 DIAGNOSIS — O99284 Endocrine, nutritional and metabolic diseases complicating childbirth: Principal | ICD-10-CM | POA: Diagnosis present

## 2015-11-04 LAB — ABO/RH: ABO/RH(D): A POS

## 2015-11-04 LAB — CBC
HCT: 39.2 % (ref 36.0–46.0)
Hemoglobin: 14 g/dL (ref 12.0–15.0)
MCH: 31.3 pg (ref 26.0–34.0)
MCHC: 35.7 g/dL (ref 30.0–36.0)
MCV: 87.5 fL (ref 78.0–100.0)
PLATELETS: 293 10*3/uL (ref 150–400)
RBC: 4.48 MIL/uL (ref 3.87–5.11)
RDW: 13.3 % (ref 11.5–15.5)
WBC: 7.3 10*3/uL (ref 4.0–10.5)

## 2015-11-04 LAB — TYPE AND SCREEN
ABO/RH(D): A POS
Antibody Screen: NEGATIVE

## 2015-11-04 MED ORDER — OXYTOCIN 40 UNITS IN LACTATED RINGERS INFUSION - SIMPLE MED: 2.5000 [IU]/h | INTRAVENOUS | Status: DC

## 2015-11-04 MED ORDER — PHENYLEPHRINE 40 MCG/ML (10ML) SYRINGE FOR IV PUSH (FOR BLOOD PRESSURE SUPPORT)
80.0000 ug | PREFILLED_SYRINGE | INTRAVENOUS | Status: DC | PRN
Start: 2015-11-04 — End: 2015-11-04
  Filled 2015-11-04: qty 5

## 2015-11-04 MED ORDER — LIDOCAINE HCL (PF) 1 % IJ SOLN
INTRAMUSCULAR | Status: DC | PRN
  Administered 2015-11-04 (×2): 4 mL via EPIDURAL

## 2015-11-04 MED ORDER — ONDANSETRON HCL 4 MG/2ML IJ SOLN: 4.0000 mg | Freq: Four times a day (QID) | INTRAMUSCULAR | Status: DC | PRN

## 2015-11-04 MED ORDER — LACTATED RINGERS IV SOLN: 500.0000 mL | Freq: Once | INTRAVENOUS | Status: DC

## 2015-11-04 MED ORDER — OXYCODONE-ACETAMINOPHEN 5-325 MG PO TABS
2.0000 | ORAL_TABLET | ORAL | Status: DC | PRN
Start: 2015-11-04 — End: 2015-11-04

## 2015-11-04 MED ORDER — OXYCODONE-ACETAMINOPHEN 5-325 MG PO TABS
1.0000 | ORAL_TABLET | ORAL | Status: DC | PRN
Start: 2015-11-04 — End: 2015-11-04

## 2015-11-04 MED ORDER — LACTATED RINGERS IV SOLN: 500.0000 mL | INTRAVENOUS | Status: DC | PRN

## 2015-11-04 MED ORDER — ACETAMINOPHEN 325 MG PO TABS: 650.0000 mg | ORAL_TABLET | ORAL | Status: DC | PRN

## 2015-11-04 MED ORDER — PENICILLIN G POTASSIUM 5000000 UNITS IJ SOLR
5.0000 10*6.[IU] | Freq: Once | INTRAVENOUS | Status: AC
  Administered 2015-11-04: 5 10*6.[IU] via INTRAVENOUS
  Filled 2015-11-04: qty 5

## 2015-11-04 MED ORDER — SOD CITRATE-CITRIC ACID 500-334 MG/5ML PO SOLN: 30.0000 mL | ORAL | Status: DC | PRN

## 2015-11-04 MED ORDER — PRENATAL MULTIVITAMIN CH
1.0000 | ORAL_TABLET | Freq: Every day | ORAL | Status: DC
  Administered 2015-11-05: 1 via ORAL
  Filled 2015-11-04: qty 1

## 2015-11-04 MED ORDER — SIMETHICONE 80 MG PO CHEW: 80.0000 mg | CHEWABLE_TABLET | ORAL | Status: DC | PRN

## 2015-11-04 MED ORDER — EPHEDRINE 5 MG/ML INJ
10.0000 mg | INTRAVENOUS | Status: DC | PRN
  Filled 2015-11-04: qty 2

## 2015-11-04 MED ORDER — PENICILLIN G POTASSIUM 5000000 UNITS IJ SOLR
2.5000 10*6.[IU] | INTRAVENOUS | Status: DC
  Filled 2015-11-04 (×2): qty 2.5

## 2015-11-04 MED ORDER — ONDANSETRON HCL 4 MG/2ML IJ SOLN: 4.0000 mg | INTRAMUSCULAR | Status: DC | PRN

## 2015-11-04 MED ORDER — SENNOSIDES-DOCUSATE SODIUM 8.6-50 MG PO TABS
2.0000 | ORAL_TABLET | ORAL | Status: DC
  Administered 2015-11-04 – 2015-11-06 (×2): 2 via ORAL
  Filled 2015-11-04 (×2): qty 2

## 2015-11-04 MED ORDER — ONDANSETRON HCL 4 MG PO TABS: 4.0000 mg | ORAL_TABLET | ORAL | Status: DC | PRN

## 2015-11-04 MED ORDER — DIPHENHYDRAMINE HCL 25 MG PO CAPS: 25.0000 mg | ORAL_CAPSULE | Freq: Four times a day (QID) | ORAL | Status: DC | PRN

## 2015-11-04 MED ORDER — LEVOTHYROXINE SODIUM 137 MCG PO TABS
137.0000 ug | ORAL_TABLET | Freq: Every day | ORAL | Status: DC
  Administered 2015-11-05 – 2015-11-06 (×2): 137 ug via ORAL
  Filled 2015-11-04 (×2): qty 1

## 2015-11-04 MED ORDER — COCONUT OIL OIL: 1.0000 "application " | TOPICAL_OIL | Status: DC | PRN

## 2015-11-04 MED ORDER — DIPHENHYDRAMINE HCL 50 MG/ML IJ SOLN: 12.5000 mg | INTRAMUSCULAR | Status: DC | PRN

## 2015-11-04 MED ORDER — LIDOCAINE HCL (PF) 1 % IJ SOLN
30.0000 mL | INTRAMUSCULAR | Status: DC | PRN
  Filled 2015-11-04: qty 30

## 2015-11-04 MED ORDER — FENTANYL 2.5 MCG/ML BUPIVACAINE 1/10 % EPIDURAL INFUSION (WH - ANES)
14.0000 mL/h | INTRAMUSCULAR | Status: DC | PRN
  Administered 2015-11-04: 14 mL/h via EPIDURAL
  Filled 2015-11-04: qty 125

## 2015-11-04 MED ORDER — DIBUCAINE 1 % RE OINT: 1.0000 "application " | TOPICAL_OINTMENT | RECTAL | Status: DC | PRN

## 2015-11-04 MED ORDER — TERBUTALINE SULFATE 1 MG/ML IJ SOLN
0.2500 mg | Freq: Once | INTRAMUSCULAR | Status: DC | PRN
  Filled 2015-11-04: qty 1

## 2015-11-04 MED ORDER — FERROUS SULFATE 325 (65 FE) MG PO TABS
325.0000 mg | ORAL_TABLET | Freq: Two times a day (BID) | ORAL | Status: DC
  Administered 2015-11-05 – 2015-11-06 (×3): 325 mg via ORAL
  Filled 2015-11-04 (×3): qty 1

## 2015-11-04 MED ORDER — OXYTOCIN BOLUS FROM INFUSION
500.0000 mL | INTRAVENOUS | Status: DC
  Administered 2015-11-04: 500 mL via INTRAVENOUS

## 2015-11-04 MED ORDER — OXYTOCIN 40 UNITS IN LACTATED RINGERS INFUSION - SIMPLE MED
1.0000 m[IU]/min | INTRAVENOUS | Status: DC
  Administered 2015-11-04: 2 m[IU]/min via INTRAVENOUS
  Filled 2015-11-04: qty 1000

## 2015-11-04 MED ORDER — PHENYLEPHRINE 40 MCG/ML (10ML) SYRINGE FOR IV PUSH (FOR BLOOD PRESSURE SUPPORT)
80.0000 ug | PREFILLED_SYRINGE | INTRAVENOUS | Status: DC | PRN
Start: 2015-11-04 — End: 2015-11-04
  Filled 2015-11-04: qty 10
  Filled 2015-11-04: qty 5

## 2015-11-04 MED ORDER — LACTATED RINGERS IV SOLN
INTRAVENOUS | Status: DC
  Administered 2015-11-04 (×2): via INTRAVENOUS

## 2015-11-04 MED ORDER — IBUPROFEN 600 MG PO TABS
600.0000 mg | ORAL_TABLET | Freq: Four times a day (QID) | ORAL | Status: DC
  Administered 2015-11-04 – 2015-11-06 (×6): 600 mg via ORAL
  Filled 2015-11-04 (×6): qty 1

## 2015-11-04 MED ORDER — WITCH HAZEL-GLYCERIN EX PADS: 1.0000 "application " | MEDICATED_PAD | CUTANEOUS | Status: DC | PRN

## 2015-11-04 MED ORDER — ZOLPIDEM TARTRATE 5 MG PO TABS: 5.0000 mg | ORAL_TABLET | Freq: Every evening | ORAL | Status: DC | PRN

## 2015-11-04 MED ORDER — BENZOCAINE-MENTHOL 20-0.5 % EX AERO
1.0000 "application " | INHALATION_SPRAY | CUTANEOUS | Status: DC | PRN
  Administered 2015-11-04 – 2015-11-06 (×2): 1 via TOPICAL
  Filled 2015-11-04 (×2): qty 56

## 2015-11-04 NOTE — Anesthesia Preprocedure Evaluation (Addendum)
Anesthesia Evaluation  Patient identified by MRN, date of birth, ID band Patient awake    Reviewed: Allergy & Precautions, NPO status , Patient's Chart, lab work & pertinent test results  Airway Mallampati: II  TM Distance: >3 FB Neck ROM: Full    Dental  (+) Teeth Intact   Pulmonary neg pulmonary ROS,    Pulmonary exam normal breath sounds clear to auscultation       Cardiovascular negative cardio ROS Normal cardiovascular exam Rhythm:Regular Rate:Normal     Neuro/Psych Seizures -, Well Controlled,  Last Sz greater than 10 years ago- on no Rx negative psych ROS   GI/Hepatic Neg liver ROS, GERD  ,  Endo/Other  Hx/o papillary thyroid Ca- S/P thyroidectomy with node dissection  Renal/GU negative Renal ROS  negative genitourinary   Musculoskeletal negative musculoskeletal ROS (+)   Abdominal   Peds  Hematology negative hematology ROS (+)   Anesthesia Other Findings   Reproductive/Obstetrics (+) Pregnancy                            Anesthesia Physical Anesthesia Plan  ASA: II  Anesthesia Plan: Epidural   Post-op Pain Management:    Induction:   Airway Management Planned: Natural Airway  Additional Equipment:   Intra-op Plan:   Post-operative Plan:   Informed Consent: I have reviewed the patients History and Physical, chart, labs and discussed the procedure including the risks, benefits and alternatives for the proposed anesthesia with the patient or authorized representative who has indicated his/her understanding and acceptance.     Plan Discussed with: Anesthesiologist  Anesthesia Plan Comments:         Anesthesia Quick Evaluation

## 2015-11-04 NOTE — H&P (Signed)
Krystal Jefferson is a 30 y.o. female presenting with  SROM @ 12:45 pm clear fluid. (+) irreg ctxd. 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 Maternal Medical History:  Reason for admission: Rupture of membranes.   Fetal activity: Perceived fetal activity is normal.    Prenatal complications: no prenatal complications   OB History    Gravida Para Term Preterm AB TAB SAB Ectopic Multiple Living   2 1 1       1      Past Medical History  Diagnosis Date  . 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 SVD (spontaneous vaginal delivery) 11/05/2012  . 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 Seizures ([place])     last seizure age 46 or 40, no seizure meds since age 57 or 84   Past Surgical History  Procedure Laterality Date  . Wisdom tooth extraction    . Thyroidectomy N/A 06/18/2014    Procedure: TOTAL THYROIDECTOMY WITH LIMITED NODE DISSECTION;  Surgeon: Armandina Gemma, MD;  Location: [facility];  Service: General;  Laterality: N/A;  . Node dissection N/A 06/18/2014    Procedure: LIMITED NODE DISSECTION;  Surgeon: Armandina Gemma, MD;  Location: [facility];  Service: General;  Laterality: N/A;   Family History: family history includes Cancer in her mother; Diverticulitis in her father. Social History:  reports that she has never smoked. She has never used smokeless tobacco. She reports that she drinks alcohol. She reports that she does not use illicit drugs.   Prenatal Transfer Tool  Maternal Diabetes: No Genetic Screening: Normal Maternal Ultrasounds/Referrals: Normal Fetal Ultrasounds or other Referrals:  None Maternal Substance Abuse:  No Significant Maternal Medications:  Meds include: Syntroid Significant Maternal Lab Results:  Lab values include: Group B Strep positive Other Comments:  hypothyrodism due to thyroidectomy for thyroid cancer  Review of Systems  All other systems reviewed and are negative.   Dilation: 5.5 Effacement (%): 90 Station: 0 Exam by:: H.  Stone,RNC Blood pressure 127/65, pulse 75, temperature 98.6 F (37 C), temperature source Oral, resp. rate 16, height 5\' 9"  (1.753 m), weight 80.74 kg (178 lb), SpO2 100 %. Exam Physical Exam  Constitutional: She is oriented to person, place, and time. She appears well-developed and well-nourished.  HENT:  Head: Atraumatic.  Eyes: EOM are normal.  Neck: Neck supple.  Cardiovascular: Regular rhythm.   Respiratory: Breath sounds normal.  Musculoskeletal: She exhibits no edema.  Neurological: She is alert and oriented to person, place, and time.  Skin: Skin is warm and dry.  Psychiatric: She has a normal mood and affect.    Prenatal labs: ABO, Rh: --/--/A POS (06/12 1425) Antibody: NEG (06/12 1425) Rubella:  Immune RPR: Nonreactive (03/31 0000)  HBsAg:   negative HIV: Non-reactive (11/22 0000)  GBS: Positive (01/06 0000)   Assessment/Plan: SROM  Labor Term gestation Hypothyroidism 2nd to thyroid cancer surgery  GBS cx (+) P) admit routine labs. IV PCN. Pitocin augmentation. Analgesic prn Krystal Jefferson A 11/04/2015, 4:35 PM

## 2015-11-04 NOTE — Progress Notes (Signed)
S: comfortable  O; Pitocin 30miu Epidural  VE 5-6 /100/90/+1  Tracing ; cat 1 Ctx q 78mins  IMP: active phase GBS cx (+) Hypothyroidism P) cont increase pitocin

## 2015-11-04 NOTE — Anesthesia Procedure Notes (Signed)
Epidural Patient location during procedure: OB Start time: 11/04/2015 3:58 PM  Staffing Anesthesiologist: Josephine Igo Performed by: anesthesiologist   Preanesthetic Checklist Completed: patient identified, site marked, surgical consent, pre-op evaluation, timeout performed, IV checked, risks and benefits discussed and monitors and equipment checked  Epidural Patient position: sitting Prep: site prepped and draped and DuraPrep Patient monitoring: continuous pulse ox and blood pressure Approach: midline Location: L3-L4 Injection technique: LOR air  Needle:  Needle type: Tuohy  Needle gauge: 17 G Needle length: 9 cm and 9 Needle insertion depth: 5 cm cm Catheter type: closed end flexible Catheter size: 19 Gauge Catheter at skin depth: 10 cm Test dose: negative and Other  Assessment Events: blood not aspirated, injection not painful, no injection resistance, negative IV test and no paresthesia  Additional Notes Patient identified. Risks and benefits discussed including failed block, incomplete  Pain control, post dural puncture headache, nerve damage, paralysis, blood pressure Changes, nausea, vomiting, reactions to medications-both toxic and allergic and post Partum back pain. All questions were answered. Patient expressed understanding and wished to proceed. Sterile technique was used throughout procedure. Epidural site was Dressed with sterile barrier dressing. No paresthesias, signs of intravascular injection Or signs of intrathecal spread were encountered.  Patient was more comfortable after the epidural was dosed. Please see RN's note for documentation of vital signs and FHR which are stable.

## 2015-11-05 LAB — CBC
HEMATOCRIT: 34.3 % — AB (ref 36.0–46.0)
Hemoglobin: 12 g/dL (ref 12.0–15.0)
MCH: 30.6 pg (ref 26.0–34.0)
MCHC: 35 g/dL (ref 30.0–36.0)
MCV: 87.5 fL (ref 78.0–100.0)
PLATELETS: 223 10*3/uL (ref 150–400)
RBC: 3.92 MIL/uL (ref 3.87–5.11)
RDW: 13.5 % (ref 11.5–15.5)
WBC: 10.6 10*3/uL — AB (ref 4.0–10.5)

## 2015-11-05 LAB — RPR: RPR Ser Ql: NONREACTIVE

## 2015-11-05 NOTE — Progress Notes (Signed)
Patient ID: Krystal Jefferson, female   DOB: December 25, 1985, 30 y.o.   MRN: [id number] PPD # 1  Subjective: Pt reports feeling well, tired / Pain controlled with ibuprofen Tolerating po/ Voiding without problems/ No n/v Bleeding is moderate Newborn info:  Information for the patient's newborn:  Krystal Jefferson, Krystal Jefferson [id number]  female Feeding: breast   Objective:  VS: Blood pressure 121/72, pulse 63, temperature 98.1 F (36.7 C), temperature source Oral, resp. rate 18.    Recent Labs  11/04/15 1425 11/05/15 0546  WBC 7.3 10.6*  HGB 14.0 12.0  HCT 39.2 34.3*  PLT 293 223    Blood type: A Pos Rubella:   Immune   Physical Exam:  General: alert, cooperative and no distress CV: Regular rate and rhythm Resp: clear Abdomen: soft, nontender, normal bowel sounds Uterine Fundus: firm, below umbilicus, nontender Perineum: mod edema and ecchymosis; well approximated Lochia: moderate Ext: Homans sign is negative, no sign of DVT and no edema, redness or tenderness in the calves or thighs   A/P: PPD # 1/ G2P2002/ S/P:SVD with minor labial lac Doing well Continue routine post partum orders Anticipate D/C home in AM    Darleen Crocker, MSN, [facility] 11/05/2015, 10:04 AM

## 2015-11-05 NOTE — Lactation Note (Addendum)
This note was copied from a baby's chart. Lactation Consultation Note  Patient Name: Krystal Jefferson OPPUG'[id number] Date: 11/05/2015 Reason for consult: Follow-up assessment Baby at 26 hr of life and mom was requesting help with latch. She was holding baby away from her with his arms and hands curled up under his chin. Moved baby closer to mom, had mom change her hand position on the breast, and guide baby to breast. She reported the deeper latch was less painful. Discussed nipple care. Mom was requesting Medela DEBP flanges. She has her Advanced motor from home. Given [employer] and instructed her on getting a new DEBP or kit through her health insurance.   Maternal Data    Feeding Feeding Type: Breast Fed Length of feed: 90 min  LATCH Score/Interventions Latch: Repeated attempts needed to sustain latch, nipple held in mouth throughout feeding, stimulation needed to elicit sucking reflex. Intervention(s): Adjust position;Assist with latch;Breast compression  Audible Swallowing: Spontaneous and intermittent  Type of Nipple: Everted at rest and after stimulation  Comfort (Breast/Nipple): Engorged, cracked, bleeding, large blisters, severe discomfort Problem noted: Cracked, bleeding, blisters, bruises Intervention(s): Expressed breast milk to nipple  Problem noted: Mild/Moderate discomfort;Cracked, bleeding, blisters, bruises Interventions  (Cracked/bleeding/bruising/blister): Expressed breast milk to nipple Interventions (Mild/moderate discomfort): Comfort gels  Hold (Positioning): Assistance needed to correctly position infant at breast and maintain latch. Intervention(s): Position options;Support Pillows  LATCH Score: 6  Lactation Tools Discussed/Used     Consult Status Consult Status: Follow-up Date: 11/06/15 Follow-up type: In-patient    Denzil Hughes 11/05/2015, 9:21 PM

## 2015-11-05 NOTE — Anesthesia Postprocedure Evaluation (Signed)
Anesthesia Post Note  Patient: Krystal Jefferson  Procedure(s) Performed: * No procedures listed *  Patient location during evaluation: Mother Baby Anesthesia Type: Epidural Level of consciousness: awake and alert Pain management: pain level controlled Vital Signs Assessment: post-procedure vital signs reviewed and stable Respiratory status: spontaneous breathing, nonlabored ventilation and respiratory function stable Cardiovascular status: stable Postop Assessment: no headache, no backache and epidural receding Anesthetic complications: no     Last Vitals:  Filed Vitals:   11/04/15 2359 11/05/15 0459  BP: 128/65 116/62  Pulse: 64 63  Temp: 37.6 C 37 C  Resp: 18 18    Last Pain:  Filed Vitals:   11/05/15 0513  PainSc: 4    Pain Goal:                 Riki Sheer

## 2015-11-05 NOTE — Lactation Note (Signed)
This note was copied from a baby's chart. Lactation Consultation Note  Patient Name: Krystal Jefferson [id number] Date: 11/05/2015 Reason for consult: Follow-up assessment Baby at 25 hr of life. Mom reports latching is painful on both breast now. She has a medium red blister on the L upper lateral nipple face. She repots cracking on the R nipple but none was seen at this time. She is manually expressing after bf and using comfort gels. Discussed bf positions and suggested she call at next feeding for latch help. She is requesting new flanges for her DEBP but want to wait until her parents bring it for sizing. She is aware of lactation services and support group.   Maternal Data    Feeding Feeding Type: Breast Fed  LATCH Score/Interventions Latch: Grasps breast easily, tongue down, lips flanged, rhythmical sucking.  Audible Swallowing: Spontaneous and intermittent  Type of Nipple: Everted at rest and after stimulation  Comfort (Breast/Nipple): Filling, red/small blisters or bruises, mild/mod discomfort  Problem noted: Mild/Moderate discomfort Interventions (Mild/moderate discomfort): Hand expression  Hold (Positioning): Assistance needed to correctly position infant at breast and maintain latch.  LATCH Score: 8  Lactation Tools Discussed/Used     Consult Status Consult Status: Follow-up Date: 11/05/15 Follow-up type: In-patient    Denzil Hughes 11/05/2015, 6:40 PM

## 2015-11-05 NOTE — Lactation Note (Signed)
This note was copied from a baby's chart. Lactation Consultation Note Experienced BF mom of 13 months to her now 31 yr old daughter. States she had sore nipples d/t her daughter had a tight frenulum. It was cut at 51 week old, then she didn't have any further problems.    Mom stated her lt. Nipple was hurting and very sore. Mom has good everted nipples, Lt. More everted than Rt. Lt. Nipple appeard to have little cracks thru the nipple. Comfort gels given. Mom hand expressed colostrum.  While in the room, the baby had 2 large emesis, and saturated a wet diaper. Baby has a high palate and a thick labial frenulum. Baby can move tongue past gum line. Mom states she will have peds. MD to assess for tongue tie before d/c home. Referred to Baby and Me Book in Breastfeeding section Pg. 22-23 for position options and Proper latch demonstration. Educated about newborn behavior. Mom encouraged to do skin-to-skin. Mom encouraged to feed baby 8-12 times/24 hours and with feeding cues. [place] brochure given w/resources, support groups and [place] services. Patient Name: Krystal Jefferson Today's Date: 11/05/2015 Reason for consult: Initial assessment   Maternal Data Has patient been taught Hand Expression?: Yes Does the patient have breastfeeding experience prior to this delivery?: Yes  Feeding Feeding Type: Breast Fed Length of feed: 10 min  LATCH Score/Interventions       Type of Nipple: Everted at rest and after stimulation  Comfort (Breast/Nipple): Filling, red/small blisters or bruises, mild/mod discomfort  Problem noted: Mild/Moderate discomfort Interventions (Mild/moderate discomfort): Comfort gels;Hand massage;Hand expression  Intervention(s): Skin to skin;Position options;Support Pillows;Breastfeeding basics reviewed     Lactation Tools Discussed/Used Tools: Comfort gels WIC Program: No   Consult Status Consult Status: Follow-up Date: 11/06/15 Follow-up type: In-patient    Theodoro Kalata 11/05/2015, 5:54 AM

## 2015-11-06 MED ORDER — LEVOTHYROXINE SODIUM 137 MCG PO TABS: 137.0000 ug | ORAL_TABLET | Freq: Every day | ORAL | Status: DC

## 2015-11-06 MED ORDER — IBUPROFEN 600 MG PO TABS: 600.0000 mg | ORAL_TABLET | Freq: Four times a day (QID) | ORAL | Status: DC

## 2015-11-06 NOTE — Discharge Summary (Signed)
OB Discharge Summary     Patient Name: POTA HANIS DOB: May 18, 1986 MRN: [id number]  Date of admission: 11/04/2015 Delivering MD: Roshanna Cimino   Date of discharge: 11/06/2015  Admitting diagnosis: ROM Intrauterine pregnancy: [redacted]w[redacted]d     Secondary diagnosis:  Principal Problem:   Postpartum care following vaginal delivery (11/04/15) Active Problems:   Normal labor  Additional problems: none     Discharge diagnosis: Term Pregnancy Delivered                                                                                                Post partum procedures:none  Augmentation: none  Complications: None  Hospital course:  Onset of Labor With Vaginal Delivery     30 y.o. yo [id number] at [redacted]w[redacted]d was admitted in Active Labor on 11/04/2015. Patient had an uncomplicated labor course as follows:  Membrane Rupture Time/Date: 12:45 PM ,11/04/2015   Intrapartum Procedures: Episiotomy: None [1]                                         Lacerations:  Labial [10]  Patient had a delivery of a Viable infant. 11/04/2015  Information for the patient's newborn:  Dorota, [place] [id number]  Delivery Method: Vag-Spont    Pateint had an uncomplicated postpartum course.  She is ambulating, tolerating a regular diet, passing flatus, and urinating well. Patient is discharged home in stable condition on 11/06/2015.    Physical exam  Filed Vitals:   11/05/15 0459 11/05/15 0745 11/05/15 1814 11/06/15 0607  BP: 116/62 121/72 131/82 116/71  Pulse: 63 63 66 64  Temp: 98.6 F (37 C) 98.1 F (36.7 C)  97.8 F (36.6 C)  TempSrc: Oral Oral  Oral  Resp: 18 18 18 18   Height:      Weight:      SpO2:   98%    General: alert, cooperative and no distress Lochia: appropriate Uterine Fundus: firm, midline, U-2 Perineum: RT labia minora healing well with no significant drainage, No significant erythema, No edema DVT Evaluation: No evidence of DVT seen on physical exam. Negative Homan's  sign. No cords or calf tenderness. No calf/ankle edema.  Labs: Lab Results  Component Value Date   WBC 10.6* 11/05/2015   HGB 12.0 11/05/2015   HCT 34.3* 11/05/2015   MCV 87.5 11/05/2015   PLT 223 11/05/2015   CMP Latest Ref Rng 06/22/2014  Glucose 70 - 99 mg/dL -  BUN 6 - 23 mg/dL -  Creatinine 0.50 - 1.10 mg/dL -  Sodium 135 - 145 mmol/L -  Potassium 3.5 - 5.1 mmol/L -  Chloride 96 - 112 mmol/L -  CO2 19 - 32 mmol/L -  Calcium 8.4 - 10.5 mg/dL 10.3    Discharge instruction: per After Visit Summary and "Baby and Me Booklet".  After visit meds:    Medication List    TAKE these medications        ibuprofen 600 MG tablet  Commonly known as:  ADVIL,MOTRIN  Take  1 tablet (600 mg total) by mouth every 6 (six) hours.     levothyroxine 137 MCG tablet  Commonly known as:  SYNTHROID, LEVOTHROID  Take 1 tablet (137 mcg total) by mouth daily before breakfast.        Diet: routine diet  Activity: Advance as tolerated. Pelvic rest for 6 weeks.   Outpatient follow up:6 weeks Follow up Appt:No future appointments. Follow up Visit:Call [facility] and endocrinoligst's offices to schedule F/U appts  Postpartum contraception: Undecided  Newborn Data: Live born female on 11/04/15 Birth Weight: 7 lb 12.2 oz (3520 g) APGAR: 8, 9  Baby Feeding: Breast Disposition:home with mother   11/06/2015 Laury Deep, Jerilynn Mages, CNM

## 2015-11-06 NOTE — Discharge Instructions (Signed)
Breast Pumping Tips °If you are breastfeeding, there may be times when you cannot feed your baby directly. Returning to work or going on a trip are common examples. Pumping allows you to store breast milk and feed it to your baby later.  °You may not get much milk when you first start to pump. Your breasts should start to make more after a few days. If you pump at the times you usually feed your baby, you may be able to keep making enough milk to feed your baby without also using formula. The more often you pump, the more milk you will produce.  °WHEN SHOULD I PUMP?  °· You can begin to pump soon after delivery. However, some experts recommend waiting about 4 weeks before giving your infant a bottle to make sure breastfeeding is going well.  °· If you plan to return to work, begin pumping a few weeks before. This will help you develop techniques that work best for you. It also lets you build up a supply of breast milk.   °· When you are with your infant, feed on demand and pump after each feeding.   °· When you are away from your infant for several hours, pump for about 15 minutes every 2-3 hours. Pump both breasts at the same time if you can.   °· If your infant has a formula feeding, make sure to pump around the same time.     °· If you drink any alcohol, wait 2 hours before pumping.   °HOW DO I PREPARE TO PUMP? °Your let-down reflex is the natural reaction to stimulation that makes your breast milk flow. It is easier to stimulate this reflex when you are relaxed. Find relaxation techniques that work for you. If you have difficulty with your let-down reflex, try these methods:  °· Smell one of your infant's blankets or an item of clothing.   °· Look at a picture or video of your infant.   °· Sit in a quiet, private space.   °· Massage the breast you plan to pump.   °· Place soothing warmth on the breast.   °· Play relaxing music.   °WHAT ARE SOME GENERAL BREAST PUMPING TIPS? °· Wash your hands before you pump. You  do not need to wash your nipples or breasts. °· There are three ways to pump. °¨ You can use your hand to massage and compress your breast. °¨ You can use a handheld manual pump. °¨ You can use an electric pump.   °· Make sure the suction cup (flange) on the breast pump is the right size. Place the flange directly over the nipple. If it is the wrong size or placed the wrong way, it may be painful and cause nipple damage.   °· If pumping is uncomfortable, apply a small amount of purified or modified lanolin to your nipple and areola. °· If you are using an electric pump, adjust the speed and suction power to be more comfortable. °· If pumping is painful or if you are not getting very much milk, you may need a different type of pump. A lactation consultant can help you determine what type of pump to use.   °· Keep a full water bottle near you at all times. Drinking lots of fluid helps you make more milk.  °· You can store your milk to use later. Pumped breast milk can be stored in a sealable, sterile container or plastic bag. Label all stored breast milk with the date you pumped it. °¨ Milk can stay out at room temperature for up to 8 hours. °¨   You can store your milk in the refrigerator for up to 8 days. °¨ You can store your milk in the freezer for 3 months. Thaw frozen milk using warm water. Do not put it in the microwave. °· Do not smoke. Smoking can lower your milk supply and harm your infant. If you need help quitting, ask your health care provider to recommend a program.   °WHEN SHOULD I CALL MY HEALTH CARE PROVIDER OR A LACTATION CONSULTANT? °· You are having trouble pumping. °· You are concerned that you are not making enough milk. °· You have nipple pain, soreness, or redness. °· You want to use birth control. Birth control pills may lower your milk supply. Talk to your health care provider about your options. °  °This information is not intended to replace advice given to you by your health care provider.  Make sure you discuss any questions you have with your health care provider. °  °Document Released: 10/29/2009 Document Revised: 05/16/2013 Document Reviewed: 03/03/2013 °[employer]vier Interactive Patient Education ©2016 [employer] °Postpartum Depression and Baby Blues °The postpartum period begins right after the birth of a baby. During this time, there is often a great amount of joy and excitement. It is also a time of many changes in the life of the parents. Regardless of how many times a mother gives birth, each child brings new challenges and dynamics to the family. It is not unusual to have feelings of excitement along with confusing shifts in moods, emotions, and thoughts. All mothers are at risk of developing postpartum depression or the "baby blues." These mood changes can occur right after giving birth, or they may occur many months after giving birth. The baby blues or postpartum depression can be mild or severe. Additionally, postpartum depression can go away rather quickly, or it can be a long-term condition.  °CAUSES °Raised hormone levels and the rapid drop in those levels are thought to be a main cause of postpartum depression and the baby blues. A number of hormones change during and after pregnancy. Estrogen and progesterone usually decrease right after the delivery of your baby. The levels of thyroid hormone and various cortisol steroids also rapidly drop. Other factors that play a role in these mood changes include major life events and genetics.  °RISK FACTORS °If you have any of the following risks for the baby blues or postpartum depression, know what symptoms to watch out for during the postpartum period. Risk factors that may increase the likelihood of getting the baby blues or postpartum depression include: °· Having a personal or family history of depression.   °· Having depression while being pregnant.   °· Having premenstrual mood issues or mood issues related to oral  contraceptives. °· Having a lot of life stress.   °· Having marital conflict.   °· Lacking a social support network.   °· Having a baby with special needs.   °· Having health problems, such as diabetes.   °SIGNS AND SYMPTOMS °Symptoms of baby blues include: °· Brief changes in mood, such as going from extreme happiness to sadness. °· Decreased concentration.   °· Difficulty sleeping.   °· Crying spells, tearfulness.   °· Irritability.   °· Anxiety.   °Symptoms of postpartum depression typically begin within the first month after giving birth. These symptoms include: °· Difficulty sleeping or excessive sleepiness.   °· Marked weight loss.   °· Agitation.   °· Feelings of worthlessness.   °· Lack of interest in activity or food.   °Postpartum psychosis is a very serious condition and can be dangerous. Fortunately, it is   rare. Displaying any of the following symptoms is cause for immediate medical attention. Symptoms of postpartum psychosis include:  °· Hallucinations and delusions.   °· Bizarre or disorganized behavior.   °· Confusion or disorientation.   °DIAGNOSIS  °A diagnosis is made by an evaluation of your symptoms. There are no medical or lab tests that lead to a diagnosis, but there are various questionnaires that a health care provider may use to identify those with the baby blues, postpartum depression, or psychosis. Often, a screening tool called the Edinburgh Postnatal Depression Scale is used to diagnose depression in the postpartum period.  °TREATMENT °The baby blues usually goes away on its own in 1-2 weeks. Social support is often all that is needed. You will be encouraged to get adequate sleep and rest. Occasionally, you may be given medicines to help you sleep.  °Postpartum depression requires treatment because it can last several months or longer if it is not treated. Treatment may include individual or group therapy, medicine, or both to address any social, physiological, and psychological factors  that may play a role in the depression. Regular exercise, a healthy diet, rest, and social support may also be strongly recommended.  °Postpartum psychosis is more serious and needs treatment right away. Hospitalization is often needed. °HOME CARE INSTRUCTIONS °· Get as much rest as you can. Nap when the baby sleeps.   °· Exercise regularly. Some women find yoga and walking to be beneficial.   °· Eat a balanced and nourishing diet.   °· Do little things that you enjoy. Have a cup of tea, take a bubble bath, read your favorite magazine, or listen to your favorite music. °· Avoid alcohol.   °· Ask for help with household chores, cooking, grocery shopping, or running errands as needed. Do not try to do everything.   °· Talk to people close to you about how you are feeling. Get support from your partner, family members, friends, or other new moms. °· Try to stay positive in how you think. Think about the things you are grateful for.   °· Do not spend a lot of time alone.   °· Only take over-the-counter or prescription medicine as directed by your health care provider. °· Keep all your postpartum appointments.   °· Let your health care provider know if you have any concerns.   °SEEK MEDICAL CARE IF: °You are having a reaction to or problems with your medicine. °SEEK IMMEDIATE MEDICAL CARE IF: °· You have suicidal feelings.   °· You think you may harm the baby or someone else. °MAKE SURE YOU: °· Understand these instructions. °· Will watch your condition. °· Will get help right away if you are not doing well or get worse. °  °This information is not intended to replace advice given to you by your health care provider. Make sure you discuss any questions you have with your health care provider. °  °Document Released: 02/13/2004 Document Revised: 05/16/2013 Document Reviewed: 02/20/2013 °Elsevier Interactive Patient Education ©2016 [employer] °Postpartum Care After Vaginal Delivery °After you deliver your newborn  (postpartum period), the usual stay in the hospital is 24-72 hours. If there were problems with your labor or delivery, or if you have other medical problems, you might be in the hospital longer.  °While you are in the hospital, you will receive help and instructions on how to care for yourself and your newborn during the postpartum period.  °While you are in the hospital: °· Be sure to tell your nurses if you have pain or discomfort, as well as   where you feel the pain and what makes the pain worse. °· If you had an incision made near your vagina (episiotomy) or if you had some tearing during delivery, the nurses may put ice packs on your episiotomy or tear. The ice packs may help to reduce the pain and swelling. °· If you are breastfeeding, you may feel uncomfortable contractions of your uterus for a couple of weeks. This is normal. The contractions help your uterus get back to normal size. °· It is normal to have some bleeding after delivery. °¨ For the first 1-3 days after delivery, the flow is red and the amount may be similar to a period. °¨ It is common for the flow to start and stop. °¨ In the first few days, you may pass some small clots. Let your nurses know if you begin to pass large clots or your flow increases. °¨ Do not  flush blood clots down the toilet before having the nurse look at them. °¨ During the next 3-10 days after delivery, your flow should become more watery and pink or brown-tinged in color. °¨ Ten to fourteen days after delivery, your flow should be a small amount of yellowish-white discharge. °¨ The amount of your flow will decrease over the first few weeks after delivery. Your flow may stop in 6-8 weeks. Most women have had their flow stop by 12 weeks after delivery. °· You should change your sanitary pads frequently. °· Wash your hands thoroughly with soap and water for at least 20 seconds after changing pads, using the toilet, or before holding or feeding your newborn. °· You should  feel like you need to empty your bladder within the first 6-8 hours after delivery. °· In case you become weak, lightheaded, or faint, call your nurse before you get out of bed for the first time and before you take a shower for the first time. °· Within the first few days after delivery, your breasts may begin to feel tender and full. This is called engorgement. Breast tenderness usually goes away within 48-72 hours after engorgement occurs. You may also notice milk leaking from your breasts. If you are not breastfeeding, do not stimulate your breasts. Breast stimulation can make your breasts produce more milk. °· Spending as much time as possible with your newborn is very important. During this time, you and your newborn can feel close and get to know each other. Having your newborn stay in your room (rooming in) will help to strengthen the bond with your newborn.  It will give you time to get to know your newborn and become comfortable caring for your newborn. °· Your hormones change after delivery. Sometimes the hormone changes can temporarily cause you to feel sad or tearful. These feelings should not last more than a few days. If these feelings last longer than that, you should talk to your caregiver. °· If desired, talk to your caregiver about methods of family planning or contraception. °· Talk to your caregiver about immunizations. Your caregiver may want you to have the following immunizations before leaving the hospital: °¨ Tetanus, diphtheria, and pertussis (Tdap) or tetanus and diphtheria (Td) immunization. It is very important that you and your family (including grandparents) or others caring for your newborn are up-to-date with the Tdap or Td immunizations. The Tdap or Td immunization can help protect your newborn from getting ill. °¨ Rubella immunization. °¨ Varicella (chickenpox) immunization. °¨ Influenza immunization. You should receive this annual immunization if you did not receive the    immunization during your pregnancy. °  °This information is not intended to replace advice given to you by your health care provider. Make sure you discuss any questions you have with your health care provider. °  °Document Released: 03/08/2007 Document Revised: 02/03/2012 Document Reviewed: 01/06/2012 °[employer]vier Interactive Patient Education ©2016 [employer] °Breastfeeding and Mastitis °Mastitis is inflammation of the breast tissue. It can occur in women who are breastfeeding. This can make breastfeeding painful. Mastitis will sometimes go away on its own. Your health care provider will help determine if treatment is needed. °CAUSES °Mastitis is often associated with a blocked milk (lactiferous) duct. This can happen when too much milk builds up in the breast. Causes of excess milk in the breast can include: °· Poor latch-on. If your baby is not latched onto the breast properly, she or he may not empty your breast completely while breastfeeding. °· Allowing too much time to pass between feedings. °· Wearing a bra or other clothing that is too tight. This puts extra pressure on the lactiferous ducts so milk does not flow through them as it should. °Mastitis can also be caused by a bacterial infection. Bacteria may enter the breast tissue through cuts or openings in the skin. In women who are breastfeeding, this may occur because of cracked or irritated skin. Cracks in the skin are often caused when your baby does not latch on properly to the breast. °SIGNS AND SYMPTOMS °· Swelling, redness, tenderness, and pain in an area of the breast. °· Swelling of the glands under the arm on the same side. °· Fever may or may not accompany mastitis. °If an infection is allowed to progress, a collection of pus (abscess) may develop. °DIAGNOSIS  °Your health care provider can usually diagnose mastitis based on your symptoms and a physical exam. Tests may be done to help confirm the diagnosis. These may include: °· Removal of pus  from the breast by applying pressure to the area. This pus can be examined in the lab to determine which bacteria are present. If an abscess has developed, the fluid in the abscess can be removed with a needle. This can also be used to confirm the diagnosis and determine the bacteria present. In most cases, pus will not be present. °· Blood tests to determine if your body is fighting a bacterial infection. °· Mammogram or ultrasound tests to rule out other problems or diseases. °TREATMENT  °Mastitis that occurs with breastfeeding will sometimes go away on its own. Your health care provider may choose to wait 24 hours after first seeing you to decide whether a prescription medicine is needed. If your symptoms are worse after 24 hours, your health care provider will likely prescribe an antibiotic medicine to treat the mastitis. He or she will determine which bacteria are most likely causing the infection and will then select an appropriate antibiotic medicine. This is sometimes changed based on the results of tests performed to identify the bacteria, or if there is no response to the antibiotic medicine selected. Antibiotic medicines are usually given by mouth. You may also be given medicine for pain. °HOME CARE INSTRUCTIONS °· Only take over-the-counter or prescription medicines for pain, fever, or discomfort as directed by your health care provider. °· If your health care provider prescribed an antibiotic medicine, take the medicine as directed. Make sure you finish it even if you start to feel better. °· Do not wear a tight or underwire bra. Wear a soft, supportive bra. °· Increase your fluid   intake, especially if you have a fever. °· Continue to empty the breast. Your health care provider can tell you whether this milk is safe for your infant or needs to be thrown out. You may be told to stop nursing until your health care provider thinks it is safe for your baby. Use a breast pump if you are advised to stop  nursing. °· Keep your nipples clean and dry. °· Empty the first breast completely before going to the other breast. If your baby is not emptying your breasts completely for some reason, use a breast pump to empty your breasts. °· If you go back to work, pump your breasts while at work to stay in time with your nursing schedule. °· Avoid allowing your breasts to become overly filled with milk (engorged). °SEEK MEDICAL CARE IF: °· You have pus-like discharge from the breast. °· Your symptoms do not improve with the treatment prescribed by your health care provider within 2 days. °SEEK IMMEDIATE MEDICAL CARE IF: °· Your pain and swelling are getting worse. °· You have pain that is not controlled with medicine. °· You have a red line extending from the breast toward your armpit. °· You have a fever or persistent symptoms for more than 2-3 days. °· You have a fever and your symptoms suddenly get worse. °MAKE SURE YOU:  °· Understand these instructions. °· Will watch your condition. °· Will get help right away if you are not doing well or get worse. °  °This information is not intended to replace advice given to you by your health care provider. Make sure you discuss any questions you have with your health care provider. °  °Document Released: 09/05/2004 Document Revised: 05/16/2013 Document Reviewed: 12/15/2012 °[employer]vier Interactive Patient Education ©2016 [employer] ° °Breastfeeding °Deciding to breastfeed is one of the best choices you can make for you and your baby. A change in hormones during pregnancy causes your breast tissue to grow and increases the number and size of your milk ducts. These hormones also allow proteins, sugars, and fats from your blood supply to make breast milk in your milk-producing glands. Hormones prevent breast milk from being released before your baby is born as well as prompt milk flow after birth. Once breastfeeding has begun, thoughts of your baby, as well as his or her sucking or  crying, can stimulate the release of milk from your milk-producing glands.  °BENEFITS OF BREASTFEEDING °For Your Baby °· Your first milk (colostrum) helps your baby's digestive system function better. °· There are antibodies in your milk that help your baby fight off infections. °· Your baby has a lower incidence of asthma, allergies, and sudden infant death syndrome. °· The nutrients in breast milk are better for your baby than infant formulas and are designed uniquely for your baby's needs. °· Breast milk improves your baby's brain development. °· Your baby is less likely to develop other conditions, such as childhood obesity, asthma, or type 2 diabetes mellitus. °For You °· Breastfeeding helps to create a very special bond between you and your baby. °· Breastfeeding is convenient. Breast milk is always available at the correct temperature and costs nothing. °· Breastfeeding helps to burn calories and helps you lose the weight gained during pregnancy. °· Breastfeeding makes your uterus contract to its prepregnancy size faster and slows bleeding (lochia) after you give birth.   °· Breastfeeding helps to lower your risk of developing type 2 diabetes mellitus, osteoporosis, and breast or ovarian cancer later in life. °  SIGNS THAT YOUR BABY IS HUNGRY °Early Signs of Hunger °· Increased alertness or activity. °· Stretching. °· Movement of the head from side to side. °· Movement of the head and opening of the mouth when the corner of the mouth or cheek is stroked (rooting). °· Increased sucking sounds, smacking lips, cooing, sighing, or squeaking. °· Hand-to-mouth movements. °· Increased sucking of fingers or hands. °Late Signs of Hunger °· Fussing. °· Intermittent crying. °Extreme Signs of Hunger °Signs of extreme hunger will require calming and consoling before your baby will be able to breastfeed successfully. Do not wait for the following signs of extreme hunger to occur before you initiate  breastfeeding: °· Restlessness. °· A loud, strong cry. °· Screaming. °BREASTFEEDING BASICS °Breastfeeding Initiation °· Find a comfortable place to sit or lie down, with your neck and back well supported. °· Place a pillow or rolled up blanket under your baby to bring him or her to the level of your breast (if you are seated). Nursing pillows are specially designed to help support your arms and your baby while you breastfeed. °· Make sure that your baby's abdomen is facing your abdomen. °· Gently massage your breast. With your fingertips, massage from your chest wall toward your nipple in a circular motion. This encourages milk flow. You may need to continue this action during the feeding if your milk flows slowly. °· Support your breast with 4 fingers underneath and your thumb above your nipple. Make sure your fingers are well away from your nipple and your baby's mouth. °· Stroke your baby's lips gently with your finger or nipple. °· When your baby's mouth is open wide enough, quickly bring your baby to your breast, placing your entire nipple and as much of the colored area around your nipple (areola) as possible into your baby's mouth. °¨ More areola should be visible above your baby's upper lip than below the lower lip. °¨ Your baby's tongue should be between his or her lower gum and your breast. °· Ensure that your baby's mouth is correctly positioned around your nipple (latched). Your baby's lips should create a seal on your breast and be turned out (everted). °· It is common for your baby to suck about 2-3 minutes in order to start the flow of breast milk. °Latching °Teaching your baby how to latch on to your breast properly is very important. An improper latch can cause nipple pain and decreased milk supply for you and poor weight gain in your baby. Also, if your baby is not latched onto your nipple properly, he or she may swallow some air during feeding. This can make your baby fussy. Burping your baby when  you switch breasts during the feeding can help to get rid of the air. However, teaching your baby to latch on properly is still the best way to prevent fussiness from swallowing air while breastfeeding. °Signs that your baby has successfully latched on to your nipple: °· Silent tugging or silent sucking, without causing you pain. °· Swallowing heard between every 3-4 sucks. °· Muscle movement above and in front of his or her ears while sucking. °Signs that your baby has not successfully latched on to nipple: °· Sucking sounds or smacking sounds from your baby while breastfeeding. °· Nipple pain. °If you think your baby has not latched on correctly, slip your finger into the corner of your baby's mouth to break the suction and place it between your baby's gums. Attempt breastfeeding initiation again. °Signs of Successful Breastfeeding °  Signs from your baby: °· A gradual decrease in the number of sucks or complete cessation of sucking. °· Falling asleep. °· Relaxation of his or her body. °· Retention of a small amount of milk in his or her mouth. °· Letting go of your breast by himself or herself. °Signs from you: °· Breasts that have increased in firmness, weight, and size 1-3 hours after feeding. °· Breasts that are softer immediately after breastfeeding. °· Increased milk volume, as well as a change in milk consistency and color by the fifth day of breastfeeding. °· Nipples that are not sore, cracked, or bleeding. °Signs That Your Baby is Getting Enough Milk °· Wetting at least 3 diapers in a 24-hour period. The urine should be clear and pale yellow by age 5 days. °· At least 3 stools in a 24-hour period by age 5 days. The stool should be soft and yellow. °· At least 3 stools in a 24-hour period by age 7 days. The stool should be seedy and yellow. °· No loss of weight greater than 10% of birth weight during the first 3 days of age. °· Average weight gain of 4-7 ounces (113-198 g) per week after age 4  days. °· Consistent daily weight gain by age 5 days, without weight loss after the age of 2 weeks. °After a feeding, your baby may spit up a small amount. This is common. °BREASTFEEDING FREQUENCY AND DURATION °Frequent feeding will help you make more milk and can prevent sore nipples and breast engorgement. Breastfeed when you feel the need to reduce the fullness of your breasts or when your baby shows signs of hunger. This is called "breastfeeding on demand." Avoid introducing a pacifier to your baby while you are working to establish breastfeeding (the first 4-6 weeks after your baby is born). After this time you may choose to use a pacifier. Research has shown that pacifier use during the first year of a baby's life decreases the risk of sudden infant death syndrome (SIDS). °Allow your baby to feed on each breast as long as he or she wants. Breastfeed until your baby is finished feeding. When your baby unlatches or falls asleep while feeding from the first breast, offer the second breast. Because newborns are often sleepy in the first few weeks of life, you may need to awaken your baby to get him or her to feed. °Breastfeeding times will vary from baby to baby. However, the following rules can serve as a guide to help you ensure that your baby is properly fed: °· Newborns (babies 4 weeks of age or younger) may breastfeed every 1-3 hours. °· Newborns should not go longer than 3 hours during the day or 5 hours during the night without breastfeeding. °· You should breastfeed your baby a minimum of 8 times in a 24-hour period until you begin to introduce solid foods to your baby at around 6 months of age. °BREAST MILK PUMPING °Pumping and storing breast milk allows you to ensure that your baby is exclusively fed your breast milk, even at times when you are unable to breastfeed. This is especially important if you are going back to work while you are still breastfeeding or when you are not able to be present during  feedings. Your lactation consultant can give you guidelines on how long it is safe to store breast milk. °A breast pump is a machine that allows you to pump milk from your breast into a sterile bottle. The pumped breast milk can   then be stored in a refrigerator or freezer. Some breast pumps are operated by hand, while others use electricity. Ask your lactation consultant which type will work best for you. Breast pumps can be purchased, but some hospitals and breastfeeding support groups lease breast pumps on a monthly basis. A lactation consultant can teach you how to hand express breast milk, if you prefer not to use a pump. °CARING FOR YOUR BREASTS WHILE YOU BREASTFEED °Nipples can become dry, cracked, and sore while breastfeeding. The following recommendations can help keep your breasts moisturized and healthy: °· Avoid using soap on your nipples. °· Wear a supportive bra. Although not required, special nursing bras and tank tops are designed to allow access to your breasts for breastfeeding without taking off your entire bra or top. Avoid wearing underwire-style bras or extremely tight bras. °· Air dry your nipples for 3-4 minutes after each feeding. °· Use only cotton bra pads to absorb leaked breast milk. Leaking of breast milk between feedings is normal. °· Use lanolin on your nipples after breastfeeding. Lanolin helps to maintain your skin's normal moisture barrier. If you use pure lanolin, you do not need to wash it off before feeding your baby again. Pure lanolin is not toxic to your baby. You may also hand express a few drops of breast milk and gently massage that milk into your nipples and allow the milk to air dry. °In the first few weeks after giving birth, some women experience extremely full breasts (engorgement). Engorgement can make your breasts feel heavy, warm, and tender to the touch. Engorgement peaks within 3-5 days after you give birth. The following recommendations can help ease  engorgement: °· Completely empty your breasts while breastfeeding or pumping. You may want to start by applying warm, moist heat (in the shower or with warm water-soaked hand towels) just before feeding or pumping. This increases circulation and helps the milk flow. If your baby does not completely empty your breasts while breastfeeding, pump any extra milk after he or she is finished. °· Wear a snug bra (nursing or regular) or tank top for 1-2 days to signal your body to slightly decrease milk production. °· Apply ice packs to your breasts, unless this is too uncomfortable for you. °· Make sure that your baby is latched on and positioned properly while breastfeeding. °If engorgement persists after 48 hours of following these recommendations, contact your health care provider or a lactation consultant. °OVERALL HEALTH CARE RECOMMENDATIONS WHILE BREASTFEEDING °· Eat healthy foods. Alternate between meals and snacks, eating 3 of each per day. Because what you eat affects your breast milk, some of the foods may make your baby more irritable than usual. Avoid eating these foods if you are sure that they are negatively affecting your baby. °· Drink milk, fruit juice, and water to satisfy your thirst (about 10 glasses a day). °· Rest often, relax, and continue to take your prenatal vitamins to prevent fatigue, stress, and anemia. °· Continue breast self-awareness checks. °· Avoid chewing and smoking tobacco. Chemicals from cigarettes that pass into breast milk and exposure to secondhand smoke may harm your baby. °· Avoid alcohol and drug use, including marijuana. °Some medicines that may be harmful to your baby can pass through breast milk. It is important to ask your health care provider before taking any medicine, including all over-the-counter and prescription medicine as well as vitamin and herbal supplements. °It is possible to become pregnant while breastfeeding. If birth control is desired, ask your health care    provider about options that will be safe for your baby. °SEEK MEDICAL CARE IF: °· You feel like you want to stop breastfeeding or have become frustrated with breastfeeding. °· You have painful breasts or nipples. °· Your nipples are cracked or bleeding. °· Your breasts are red, tender, or warm. °· You have a swollen area on either breast. °· You have a fever or chills. °· You have nausea or vomiting. °· You have drainage other than breast milk from your nipples. °· Your breasts do not become full before feedings by the fifth day after you give birth. °· You feel sad and depressed. °· Your baby is too sleepy to eat well. °· Your baby is having trouble sleeping.   °· Your baby is wetting less than 3 diapers in a 24-hour period. °· Your baby has less than 3 stools in a 24-hour period. °· Your baby's skin or the white part of his or her eyes becomes yellow.   °· Your baby is not gaining weight by 5 days of age. °SEEK IMMEDIATE MEDICAL CARE IF: °· Your baby is overly tired (lethargic) and does not want to wake up and feed. °· Your baby develops an unexplained fever. °  °This information is not intended to replace advice given to you by your health care provider. Make sure you discuss any questions you have with your health care provider. °  °Document Released: 05/11/2005 Document Revised: 01/30/2015 Document Reviewed: 11/02/2012 °Elsevier Interactive Patient Education ©2016 [employer] ° °

## 2015-11-06 NOTE — Progress Notes (Signed)
Patient ID: Krystal Jefferson, female   DOB: [birth year], 30 y.o.   MRN: [id number] Post Partum Day #2            Information for the patient's newborn:  Sadieann, [facility] [id number]  female   / circumcision done Feeding: breast  Subjective: No HA, SOB, CP, F/C, breast symptoms. Pain well-managed with ibuprofen. Normal vaginal bleeding, no clots.      Objective:  Temp:  [97.8 F (36.6 C)] 97.8 F (36.6 C) (06/14 0607) Pulse Rate:  [64-66] 64 (06/14 0607) Resp:  [18] 18 (06/14 0607) BP: (116-131)/(71-82) 116/71 mmHg (06/14 0607) SpO2:  [98 %] 98 % (06/13 1814)  No intake or output data in the 24 hours ending 11/06/15 0834     Recent Labs  11/04/15 1425 11/05/15 0546  WBC 7.3 10.6*  HGB 14.0 12.0  HCT 39.2 34.3*  PLT 293 223    Blood type: A POS (06/12 1425) Rubella: Immune   Physical Exam:  General: alert, cooperative and no distress Uterine Fundus: firm, midline, U-2 Lochia: appropriate Perineum: RT labia minora repair healing well, edema none DVT Evaluation: No evidence of DVT seen on physical exam. Negative Homan's sign. No cords or calf tenderness. No significant calf/ankle edema.    Assessment/Plan: PPD # 2 / 30 y.o., [id number] S/P: spontaneous vaginal  Principal Problem:    Postpartum care following vaginal delivery (11/04/15)    normal postpartum exam  Continue current postpartum care  D/C home today   LOS: 2 days   Laury Deep, M, MSN, CNM 11/06/2015, 8:34 AM

## 2017-06-08 IMAGING — NM NM [ID] THYROID CANCER METS WHOLE BODY W/ THYROGEN
6 series · 6 of 6 positions shown · non-contrast
Comparison: 08/15/2014

CLINICAL DATA: Followup thyroid cancer

EXAM:
THYROGEN-STIMULATED V-T9T WHOLE BODY SCAN
TECHNIQUE: The patient received 0.9 mg Thyrogen intramuscularly every 24 hours
for two doses. On the third day the patient returned and received
the radiopharmaceutical, per orally. On the fifth day, the patient
returned and whole body planar images were obtained in the anterior
and posterior projections.
RADIOPHARMACEUTICALS:  4.0 mCi V-T9T sodium iodide orally

[Series 1: marker · 4.14mm/px · 1 of 1 slices shown (1 of 2)]
[im 1/1]
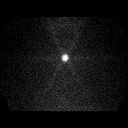

[Series 1: marker · 4.14mm/px · 1 of 1 slices shown (2 of 2)]
[im 1/1  full-range]
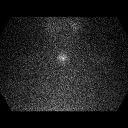

[Series 2: static thyroid no marker · 4.14mm/px · 1 of 1 slices shown (1 of 2)]
[im 1/1  full-range]
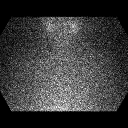

[Series 2: static thyroid no marker · 4.14mm/px · 1 of 1 slices shown (2 of 2)]
[im 1/1  full-range]
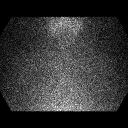

[Series 3: i131 whole body · 2.66mm/px · 1 of 1 slices shown (1 of 2)]
[im 1/1  full-range]
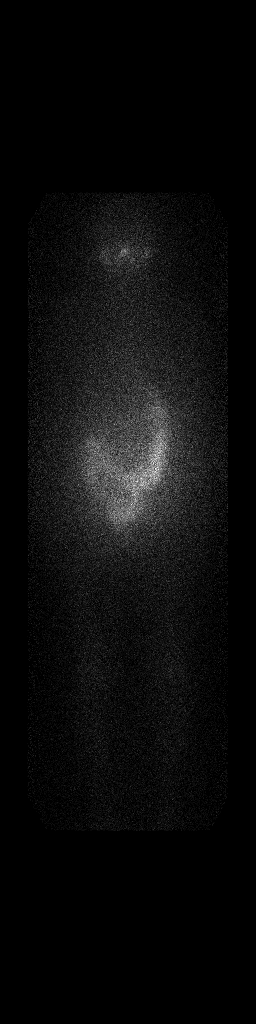

[Series 3: i131 whole body · 2.66mm/px · 1 of 1 slices shown (2 of 2)]
[im 1/1  full-range]
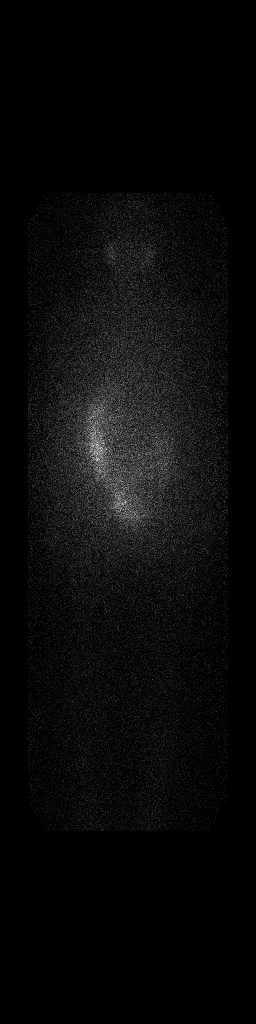

[6 of 6 positions shown; findings below may reference images not displayed]

FINDINGS: There is no residual radio iodine uptake within the thyroid bed to
suggest functioning iodine avid thyroid tissue. No evidence for
distant metastatic disease. Normal physiologic tracer activity seen
within the salivary glands, GI and GU tract.
IMPRESSION: 1. No evidence for residual or recurrent iodine avid functioning
thyroid tissue.

## 2018-05-04 ENCOUNTER — Encounter: Payer: Self-pay | Admitting: Certified Nurse Midwife

## 2018-05-13 LAB — OB RESULTS CONSOLE HEPATITIS B SURFACE ANTIGEN: Hepatitis B Surface Ag: NEGATIVE

## 2018-05-13 LAB — OB RESULTS CONSOLE RPR: RPR: NONREACTIVE

## 2018-05-13 LAB — OB RESULTS CONSOLE GC/CHLAMYDIA
Chlamydia: NEGATIVE
Gonorrhea: NEGATIVE

## 2018-05-13 LAB — OB RESULTS CONSOLE ANTIBODY SCREEN: Antibody Screen: NEGATIVE

## 2018-05-13 LAB — OB RESULTS CONSOLE HIV ANTIBODY (ROUTINE TESTING): HIV: NONREACTIVE

## 2018-05-13 LAB — OB RESULTS CONSOLE RUBELLA ANTIBODY, IGM: Rubella: IMMUNE

## 2018-05-13 LAB — OB RESULTS CONSOLE GBS: GBS: POSITIVE

## 2018-05-13 LAB — OB RESULTS CONSOLE ABO/RH: RH Type: POSITIVE

## 2018-05-25 NOTE — L&D Delivery Note (Signed)
Delivery Note   Called for admission from [facility] at 8:09 At 8:29 AM a viable female, named [name], was delivered via Vaginal, Spontaneous (Presentation: ROA ).  APGAR: 8, 9; weight pending   Placenta status: spontaneous, 3 Vx cord  Anesthesia:  Pudendal block with 1% Lidocaine Episiotomy: None Lacerations: None Est. Blood Loss (mL): 300  Mom to postpartum.  Baby to Couplet care / Skin to Skin.  Due to rapid progression, patient did not receive ATB prophylaxis for GBS +. Patient aware will likely need to stay 48 hours.  Katharine Look A Nyara Capell 10/07/2018, 8:56 AM

## 2018-10-07 ENCOUNTER — Other Ambulatory Visit: Payer: Self-pay

## 2018-10-07 ENCOUNTER — Encounter (HOSPITAL_COMMUNITY): Payer: Self-pay | Admitting: *Deleted

## 2018-10-07 ENCOUNTER — Inpatient Hospital Stay (HOSPITAL_COMMUNITY)
Admission: AD | Admit: 2018-10-07 | Discharge: 2018-10-09 | DRG: 807 | Disposition: A | Discharge status: Home / Self Care | Payer: Medicaid Other | Attending: Obstetrics and Gynecology | Admitting: Obstetrics and Gynecology

## 2018-10-07 DIAGNOSIS — Z1159 Encounter for screening for other viral diseases: Secondary | ICD-10-CM | POA: Diagnosis not present

## 2018-10-07 DIAGNOSIS — Z3A38 38 weeks gestation of pregnancy: Secondary | ICD-10-CM | POA: Diagnosis not present

## 2018-10-07 DIAGNOSIS — O26893 Other specified pregnancy related conditions, third trimester: Secondary | ICD-10-CM | POA: Diagnosis present

## 2018-10-07 DIAGNOSIS — O99824 Streptococcus B carrier state complicating childbirth: Principal | ICD-10-CM | POA: Diagnosis present

## 2018-10-07 DIAGNOSIS — Z85850 Personal history of malignant neoplasm of thyroid: Secondary | ICD-10-CM | POA: Diagnosis not present

## 2018-10-07 LAB — CBC
HCT: 40.2 % (ref 36.0–46.0)
Hemoglobin: 14.1 g/dL (ref 12.0–15.0)
MCH: 30.6 pg (ref 26.0–34.0)
MCHC: 35.1 g/dL (ref 30.0–36.0)
MCV: 87.2 fL (ref 80.0–100.0)
Platelets: 301 10*3/uL (ref 150–400)
RBC: 4.61 MIL/uL (ref 3.87–5.11)
RDW: 12.8 % (ref 11.5–15.5)
WBC: 5.9 10*3/uL (ref 4.0–10.5)
nRBC: 0 % (ref 0.0–0.2)

## 2018-10-07 LAB — COMPREHENSIVE METABOLIC PANEL
ALT: 26 U/L (ref 0–44)
AST: 25 U/L (ref 15–41)
Albumin: 2.8 g/dL — ABNORMAL LOW (ref 3.5–5.0)
Alkaline Phosphatase: 672 U/L — ABNORMAL HIGH (ref 38–126)
Anion gap: 10 (ref 5–15)
BUN: <5 mg/dL — ABNORMAL LOW (ref 6–20)
CO2: 24 mmol/L (ref 22–32)
Calcium: 8.6 mg/dL — ABNORMAL LOW (ref 8.9–10.3)
Chloride: 102 mmol/L (ref 98–111)
Creatinine, Ser: 0.65 mg/dL (ref 0.44–1.00)
GFR calc Af Amer: >60 mL/min (ref >60)
GFR calc non Af Amer: >60 mL/min (ref >60)
Glucose, Bld: 113 mg/dL — ABNORMAL HIGH (ref 70–99)
Potassium: 3.9 mmol/L (ref 3.5–5.1)
Sodium: 136 mmol/L (ref 135–145)
Total Bilirubin: 0.8 mg/dL (ref 0.3–1.2)
Total Protein: 6.1 g/dL — ABNORMAL LOW (ref 6.5–8.1)

## 2018-10-07 LAB — TYPE AND SCREEN
ABO/RH(D): A POS
Antibody Screen: NEGATIVE

## 2018-10-07 LAB — SARS CORONAVIRUS 2 BY RT PCR (HOSPITAL ORDER, PERFORMED IN ~~LOC~~ HOSPITAL LAB): SARS Coronavirus 2: NEGATIVE

## 2018-10-07 LAB — RPR: RPR Ser Ql: NONREACTIVE

## 2018-10-07 LAB — ABO/RH: ABO/RH(D): A POS

## 2018-10-07 MED ORDER — OXYTOCIN BOLUS FROM INFUSION: 500.0000 mL | Freq: Once | INTRAVENOUS | Status: DC

## 2018-10-07 MED ORDER — LACTATED RINGERS IV SOLN: 500.0000 mL | Freq: Once | INTRAVENOUS | Status: DC

## 2018-10-07 MED ORDER — TETANUS-DIPHTH-ACELL PERTUSSIS 5-2.5-18.5 LF-MCG/0.5 IM SUSP: 0.5000 mL | Freq: Once | INTRAMUSCULAR | Status: DC

## 2018-10-07 MED ORDER — LEVOTHYROXINE SODIUM 137 MCG PO TABS
137.0000 ug | ORAL_TABLET | Freq: Every day | ORAL | Status: DC
  Administered 2018-10-08: 137 ug via ORAL
  Filled 2018-10-07 (×2): qty 1

## 2018-10-07 MED ORDER — SIMETHICONE 80 MG PO CHEW: 80.0000 mg | CHEWABLE_TABLET | ORAL | Status: DC | PRN

## 2018-10-07 MED ORDER — ONDANSETRON HCL 4 MG PO TABS: 4.0000 mg | ORAL_TABLET | ORAL | Status: DC | PRN

## 2018-10-07 MED ORDER — FLEET ENEMA 7-19 GM/118ML RE ENEM: 1.0000 | ENEMA | RECTAL | Status: DC | PRN

## 2018-10-07 MED ORDER — OXYTOCIN 40 UNITS IN NORMAL SALINE INFUSION - SIMPLE MED
INTRAVENOUS | Status: AC
  Filled 2018-10-07: qty 1000

## 2018-10-07 MED ORDER — COCONUT OIL OIL: 1.0000 "application " | TOPICAL_OIL | Status: DC | PRN

## 2018-10-07 MED ORDER — IBUPROFEN 600 MG PO TABS: 600.0000 mg | ORAL_TABLET | Freq: Four times a day (QID) | ORAL | Status: DC

## 2018-10-07 MED ORDER — MEASLES, MUMPS & RUBELLA VAC IJ SOLR: 0.5000 mL | Freq: Once | INTRAMUSCULAR | Status: DC

## 2018-10-07 MED ORDER — EPHEDRINE 5 MG/ML INJ: 10.0000 mg | INTRAVENOUS | Status: DC | PRN

## 2018-10-07 MED ORDER — PRENATAL MULTIVITAMIN CH
1.0000 | ORAL_TABLET | Freq: Every day | ORAL | Status: DC
  Administered 2018-10-07 – 2018-10-09 (×3): 1 via ORAL
  Filled 2018-10-07 (×3): qty 1

## 2018-10-07 MED ORDER — LACTATED RINGERS IV SOLN
INTRAVENOUS | Status: DC
  Administered 2018-10-07: 08:00:00 via INTRAVENOUS

## 2018-10-07 MED ORDER — LIDOCAINE HCL (PF) 1 % IJ SOLN
30.0000 mL | Freq: Once | INTRAMUSCULAR | Status: AC
  Administered 2018-10-07: 30 mL via INTRADERMAL

## 2018-10-07 MED ORDER — SOD CITRATE-CITRIC ACID 500-334 MG/5ML PO SOLN: 30.0000 mL | ORAL | Status: DC | PRN

## 2018-10-07 MED ORDER — SENNOSIDES-DOCUSATE SODIUM 8.6-50 MG PO TABS
2.0000 | ORAL_TABLET | ORAL | Status: DC
  Administered 2018-10-08 (×2): 2 via ORAL
  Filled 2018-10-07 (×2): qty 2

## 2018-10-07 MED ORDER — IBUPROFEN 600 MG PO TABS
600.0000 mg | ORAL_TABLET | Freq: Four times a day (QID) | ORAL | Status: DC
  Administered 2018-10-07 – 2018-10-09 (×8): 600 mg via ORAL
  Filled 2018-10-07 (×9): qty 1

## 2018-10-07 MED ORDER — FERROUS SULFATE 325 (65 FE) MG PO TABS
325.0000 mg | ORAL_TABLET | Freq: Two times a day (BID) | ORAL | Status: DC
  Administered 2018-10-07 – 2018-10-09 (×4): 325 mg via ORAL
  Filled 2018-10-07 (×4): qty 1

## 2018-10-07 MED ORDER — OXYTOCIN 40 UNITS IN NORMAL SALINE INFUSION - SIMPLE MED: 2.5000 [IU]/h | INTRAVENOUS | Status: DC

## 2018-10-07 MED ORDER — ACETAMINOPHEN 325 MG PO TABS: 650.0000 mg | ORAL_TABLET | ORAL | Status: DC | PRN

## 2018-10-07 MED ORDER — FENTANYL-BUPIVACAINE-NACL 0.5-0.125-0.9 MG/250ML-% EP SOLN: 12.0000 mL/h | EPIDURAL | Status: DC | PRN

## 2018-10-07 MED ORDER — ZOLPIDEM TARTRATE 5 MG PO TABS: 5.0000 mg | ORAL_TABLET | Freq: Every evening | ORAL | Status: DC | PRN

## 2018-10-07 MED ORDER — PHENYLEPHRINE 40 MCG/ML (10ML) SYRINGE FOR IV PUSH (FOR BLOOD PRESSURE SUPPORT): 80.0000 ug | PREFILLED_SYRINGE | INTRAVENOUS | Status: DC | PRN

## 2018-10-07 MED ORDER — DIBUCAINE (PERIANAL) 1 % EX OINT: 1.0000 "application " | TOPICAL_OINTMENT | CUTANEOUS | Status: DC | PRN

## 2018-10-07 MED ORDER — BENZOCAINE-MENTHOL 20-0.5 % EX AERO
1.0000 "application " | INHALATION_SPRAY | CUTANEOUS | Status: DC | PRN
  Administered 2018-10-07: 1 via TOPICAL
  Filled 2018-10-07: qty 56

## 2018-10-07 MED ORDER — ONDANSETRON HCL 4 MG/2ML IJ SOLN: 4.0000 mg | Freq: Four times a day (QID) | INTRAMUSCULAR | Status: DC | PRN

## 2018-10-07 MED ORDER — OXYTOCIN 10 UNIT/ML IJ SOLN
INTRAMUSCULAR | Status: AC
  Administered 2018-10-07: 10 [IU]
  Filled 2018-10-07: qty 1

## 2018-10-07 MED ORDER — LIDOCAINE HCL (PF) 1 % IJ SOLN
INTRAMUSCULAR | Status: AC
  Filled 2018-10-07: qty 30

## 2018-10-07 MED ORDER — DIPHENHYDRAMINE HCL 50 MG/ML IJ SOLN: 12.5000 mg | INTRAMUSCULAR | Status: DC | PRN

## 2018-10-07 MED ORDER — LIDOCAINE HCL (PF) 1 % IJ SOLN: 30.0000 mL | INTRAMUSCULAR | Status: DC | PRN

## 2018-10-07 MED ORDER — SODIUM CHLORIDE 0.9 % IV SOLN
2.0000 g | Freq: Once | INTRAVENOUS | Status: AC
  Administered 2018-10-07: 2 g via INTRAVENOUS
  Filled 2018-10-07: qty 2000

## 2018-10-07 MED ORDER — OXYCODONE-ACETAMINOPHEN 5-325 MG PO TABS: 1.0000 | ORAL_TABLET | ORAL | Status: DC | PRN

## 2018-10-07 MED ORDER — PRENATAL MULTIVITAMIN CH: 1.0000 | ORAL_TABLET | Freq: Every day | ORAL | Status: DC

## 2018-10-07 MED ORDER — OXYCODONE-ACETAMINOPHEN 5-325 MG PO TABS: 2.0000 | ORAL_TABLET | ORAL | Status: DC | PRN

## 2018-10-07 MED ORDER — WITCH HAZEL-GLYCERIN EX PADS: 1.0000 "application " | MEDICATED_PAD | CUTANEOUS | Status: DC | PRN

## 2018-10-07 MED ORDER — LACTATED RINGERS IV SOLN: 500.0000 mL | INTRAVENOUS | Status: DC | PRN

## 2018-10-07 MED ORDER — ONDANSETRON HCL 4 MG/2ML IJ SOLN: 4.0000 mg | INTRAMUSCULAR | Status: DC | PRN

## 2018-10-07 MED ORDER — DIPHENHYDRAMINE HCL 25 MG PO CAPS: 25.0000 mg | ORAL_CAPSULE | Freq: Four times a day (QID) | ORAL | Status: DC | PRN

## 2018-10-07 NOTE — Lactation Note (Signed)
This note was copied from a baby's chart. Lactation Consultation Note  Patient Name: Krystal Jefferson [id number] Date: 10/07/2018 Reason for consult: Initial assessment;Early term 37-38.6wks P3. Mom breastfed her first for one year and second for 2 years.  Her first baby had a tight frenulum which required revision. Infant is 6 hours old and has fed twice.  Mom states it is somewhat difficult to obtain a deep latch and her nipple is flattened after feed.  She is using cross cradle hold.  Reviewed correct techniques for positioning and latch.  Attempted latch but baby too sleepy.  Nipples erect and breast compressible.  Mom would like infant's frenulum to be evaluated.  On exam posterior frenulum appears short.  Baby can extend tongue well but some possible restriction elevating tongue noted.  Discussed with mom that some babies do well with a restriction and it's reasonable to wait and watch.  Reviewed first 24 hour behavior.  Instructed to feed with cues and call for assist prn.  Breastfeeding consultation services and support information given and reviewed.  Maternal Data Has patient been taught Hand Expression?: Yes Does the patient have breastfeeding experience prior to this delivery?: Yes  Feeding    LATCH Score                   Interventions Interventions: Breast feeding basics reviewed  Lactation Tools Discussed/Used     Consult Status Consult Status: Follow-up Date: 10/08/18 Follow-up type: In-patient    Ave Filter 10/07/2018, 2:34 PM

## 2018-10-07 NOTE — MAU Note (Signed)
PT presents to MAU with complaints of contractions that started at 0515. Denies any vaginal bleeding or LOF. +FM

## 2018-10-07 NOTE — H&P (Signed)
Krystal Jefferson is a 33 y.o. female, G4P2 at 38+5 weeks, presenting for regular uterine contractions since 5:15 this am.  Pregnancy followed at [facility] since 15+5  weeks and remarkable for:  1. Thyroid papillary carcinoma 2016, s/p thyroidectomy, on syntroid 175 mcg daily, followed by Dr Altheimer 2. GBS +  OB History    Gravida  3   Para  2   Term  2   Preterm      AB      Living  2     SAB      TAB      Ectopic      Multiple  0   Live Births  2          Past Medical History:  Diagnosis Date  . 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 Cancer (HCC)    papillary thryoid  . Seizures ([place])    last seizure age 60 or 4, no seizure meds since age 28 or 18  . SVD (spontaneous vaginal delivery) 11/05/2012   Past Surgical History:  Procedure Laterality Date  . NODE DISSECTION N/A 06/18/2014   Procedure: LIMITED NODE DISSECTION;  Surgeon: Armandina Gemma, MD;  Location: [facility];  Service: General;  Laterality: N/A;  . THYROIDECTOMY N/A 06/18/2014   Procedure: TOTAL THYROIDECTOMY WITH LIMITED NODE DISSECTION;  Surgeon: Armandina Gemma, MD;  Location: [facility];  Service: General;  Laterality: N/A;  . WISDOM TOOTH EXTRACTION      Family History:   family history includes Cancer in her mother; Diverticulitis in her father. Social History:    reports that she has never smoked. She has never used smokeless tobacco. She reports current alcohol use. She reports that she does not use drugs.   Prenatal labs: ABO, Rh: A/Positive/-- (12/20 0000) Antibody: Negative (12/20 0000) Rubella: Immune (12/20 0000) RPR: Nonreactive (12/20 0000)  HBsAg: Negative (12/20 0000)  HIV: Non-reactive (12/20 0000)  GBS: Positive (12/20 0000)    Prenatal Transfer Tool  Maternal Diabetes: No Genetic Screening: Declined Maternal Ultrasounds/Referrals: Normal Fetal Ultrasounds or other Referrals:  None Maternal Substance Abuse:  No Significant Maternal Medications:  None Significant Maternal  Lab Results: None   Dilation: 10 Effacement (%): 100 Station: Plus 2 Exam by:: Krystal Eaton RN  Blood pressure (!) 147/85, pulse 81, temperature 98.3 F (36.8 C), resp. rate (!) 22, height 5\' 9"  (1.753 m), weight 87.1 kg, last menstrual period 01/09/2018, unknown if currently breastfeeding.  General Appearance: Alert, appropriate appearance for age. No acute distress HEENT Exam: Grossly normal Psychiatric Exam: Alert and oriented, appropriate affect  ++++++++++++++++++++++++++++++++++++++++++++++++++++++++++++++++  Vaginal exam: C/100/BBW at +2  Fetal tracings: Category 1  ++++++++++++++++++++++++++++++++++++++++++++++++++++++++++++++++   Assessment/Plan:  38+5 weeks with imminent delivery Fetal tracings reassuring   Delsa Bern MD 10/07/2018, 8:52 AM

## 2018-10-08 LAB — CBC
HCT: 37 % (ref 36.0–46.0)
Hemoglobin: 12.8 g/dL (ref 12.0–15.0)
MCH: 30.5 pg (ref 26.0–34.0)
MCHC: 34.6 g/dL (ref 30.0–36.0)
MCV: 88.1 fL (ref 80.0–100.0)
Platelets: 236 10*3/uL (ref 150–400)
RBC: 4.2 MIL/uL (ref 3.87–5.11)
RDW: 13.1 % (ref 11.5–15.5)
WBC: 9.1 10*3/uL (ref 4.0–10.5)
nRBC: 0 % (ref 0.0–0.2)

## 2018-10-08 NOTE — Progress Notes (Signed)
Post Partum Day 1 Subjective: no complaints, up ad lib, voiding and tolerating PO. Breastfeeding going well.   Objective: Vitals:   10/07/18 1205 10/07/18 1645 10/07/18 2100 10/08/18 0100  BP: 120/77 126/84 130/77 123/81  Pulse: 67 61 69 72  Resp: 19 18 20 16   Temp: (!) 97.5 F (36.4 C) 98.8 F (37.1 C) 98.6 F (37 C) 98 F (36.7 C)  TempSrc: Axillary Oral Oral Oral  SpO2: 100%  99% 97%  Weight:      Height:        Physical Exam:  General: alert and cooperative Lochia: appropriate Uterine Fundus: firm Incision: n/a DVT Evaluation: No evidence of DVT seen on physical exam. Negative Homan's sign. No cords or calf tenderness. No significant calf/ankle edema.  Recent Labs    10/07/18 0753 10/08/18 0423  HGB 14.1 12.8  HCT 40.2 37.0    Assessment/Plan: Plan for discharge tomorrow and Breastfeeding   LOS: 1 day   Marikay Alar 10/08/2018, 6:53 AM

## 2018-10-08 NOTE — Lactation Note (Signed)
This note was copied from a baby's chart. Lactation Consultation Note  Patient Name: Krystal Jefferson [id number] Date: 10/08/2018 Reason for consult: Follow-up assessment;Mother's request;Difficult latch;Term  65 - 47 - RN paged to assist mother with latching baby "[name]." Mom has experience with breast feeding but she states that she's having a little difficulty with getting baby to open mouth wide for a comfortable latch. I assisted mom with placing baby on her right breast in cross cradle hold.  I showed mom how to HE and then hold breast in a "U" hold. Mom gently stimulated baby's filtrum with her nipple. Baby does not open mouth wide for latch, but with compression, I instructed mom to point her nipple towards the roof of baby's mouth. Baby latched, and mom reported that it felt better.  I noted good rhythmic suckling sequences. Mom denied pain with latch. I did note that baby appeared to have a tight posterior frenulum. Mom and dad are aware. I advised a "watch and wait" approach; if mom has increased pain, or if baby is having difficulty maintaining seal, or if baby is not gaining adequately, etc, these are reasons for further evaluation.  Mom states that she had a frenectomy as a child, and that she had this procedure done with one of her previous children. She is aware of the procedure.  Normal feeding patterns in day 2 were discussed. No further questions at this time.   Maternal Data Formula Feeding for Exclusion: No Has patient been taught Hand Expression?: Yes Does the patient have breastfeeding experience prior to this delivery?: Yes  Feeding Feeding Type: Breast Fed  LATCH Score Latch: Grasps breast easily, tongue down, lips flanged, rhythmical sucking.  Audible Swallowing: A few with stimulation  Type of Nipple: Everted at rest and after stimulation  Comfort (Breast/Nipple): Soft / non-tender  Hold (Positioning): Assistance needed to correctly position infant at  breast and maintain latch.  LATCH Score: 8  Interventions Interventions: Breast feeding basics reviewed;Assisted with latch;Skin to skin;Hand express;Adjust position;Support pillows   Consult Status Consult Status: Follow-up Date: 10/09/18 Follow-up type: In-patient    Lenore Manner 10/08/2018, 3:46 PM

## 2018-10-08 NOTE — Lactation Note (Signed)
This note was copied from a baby's chart. Lactation Consultation Note  Patient Name: Krystal Jefferson Today's Date: 10/08/2018  P3, 25 hour female infant. Per mom, requested for [facility] services. LC earlier observed infant has  short frenulum. Nurse notice mom's nipple are compressed like a tube of lipstick mostly on the left side when infant comes off mom's breast. Per mom, she feels infant is breastfeeding well and she only wants tips from [facility]  in getting infant to open her mouth wider she doesn't want assistance from [facility] with latching infant to breast at this time. Mom wants to know how to make infant open her mouth wider for a deeper latch. LC discussed mom to rub her  nipple below ( infant's nose and top of  Infant's mouth) and she can hand express a little colostrum to help stimulate infant sucking reflex, once mouth is wide, bring infant to breast chin first and infant's nose should be slightly touching mom's breast for a deeper latch. Mom stated she will try tips at next feeding. LC mention to mom if she has any further questions, concerns or need assistance LC  services is available to help her.      Maternal Data    Feeding Feeding Type: Breast Fed  LATCH Score Latch: Grasps breast easily, tongue down, lips flanged, rhythmical sucking.  Audible Swallowing: A few with stimulation  Type of Nipple: Everted at rest and after stimulation  Comfort (Breast/Nipple): Soft / non-tender  Hold (Positioning): No assistance needed to correctly position infant at breast.  LATCH Score: 9  Interventions Interventions: Assisted with latch;Breast compression;Adjust position;Support pillows;Position options;Breast feeding basics reviewed  Lactation Tools Discussed/Used     Consult Status      Krystal Jefferson 10/08/2018, 1:48 AM

## 2018-10-09 MED ORDER — IBUPROFEN 600 MG PO TABS: 600.0000 mg | ORAL_TABLET | Freq: Four times a day (QID) | ORAL | 0 refills | Status: DC

## 2018-10-09 MED ORDER — LEVOTHYROXINE SODIUM 137 MCG PO TABS: 137.0000 ug | ORAL_TABLET | Freq: Every day | ORAL | 0 refills | Status: DC

## 2018-10-09 NOTE — Lactation Note (Signed)
This note was copied from a baby's chart. Lactation Consultation Note  Patient Name: Krystal Jefferson [id number]'[id number] Date: 10/09/2018 Reason for consult: Follow-up assessment;Term  [id number] - [id number] - I visited [name] to conduct discharge education. She reports that her daughter, Krystal Jefferson, breast fed well over night and this morning. She states that baby is eating 8-12 times a day, and she denies breast pain with latch.  Baby was asleep on mom's chest upon entry. Mom had breakfast waiting at the bedside.  We discussed when to expect mature milk to come in. Mom has had engorgement in the past and treated it with frequent feedings, cabbage leaves, and hand expression in the shower. I also recommended that cold compresses for a few times a day for 15 minutes at a time to help alleviate the swelling.  Mom reports that she may have an old used pump at home. She does not plan to pump (she states it's not her thing). I discussed the [employer] pump as an option for times when she's leaking, and mom expressed interest.  Mom states that baby latches better on the right and does not open as wide on the left. I suggested that she might try to move baby in the football hold on her right breast since baby latches well in cross cradle hold on her right breast.  Mom had no questions or concerns at this time. She will follow up in the next two days with [facility].  Feeding Feeding Type: Breast Fed   Interventions Interventions: Breast feeding basics reviewed(Discharge education)    Consult Status Consult Status: Complete Date: 10/09/18 Follow-up type: Call as needed    Lenore Manner 10/09/2018, 8:36 AM

## 2018-10-09 NOTE — Discharge Summary (Signed)
SVD OB Discharge Summary     Patient Name: Krystal Jefferson DOB: 1985/06/08 MRN: [id number]  Date of admission: 10/07/2018 Delivering MD: Delsa Bern  Date of delivery: 10/07/2018 Type of delivery: SVD  Newborn Data: Sex: Baby Female  Live born female  Birth Weight: 7 lb 13.4 oz (3555 g) APGAR: 58, 9  Newborn Delivery   Birth date/time:  10/07/2018 08:29:00 Delivery type:  Vaginal, Spontaneous     Feeding: breast Infant being discharge to home with mother in stable condition.   Admitting diagnosis: 38WKS CTX Intrauterine pregnancy: [redacted]w[redacted]d     Secondary diagnosis:  Active Problems:   Normal labor   Vaginal delivery   Normal postpartum course                                Complications: None                                                              Intrapartum Procedures: spontaneous vaginal delivery Postpartum Procedures: none Complications-Operative and Postpartum: none Augmentation: AROM   History of Present Illness: Krystal Jefferson is a 33 y.o. female, G3P3003, who presents at [redacted]w[redacted]d weeks gestation. The patient has been followed at  [facility]  Her pregnancy has been complicated by:  Patient Active Problem List   Diagnosis Date Noted  . Normal postpartum course 10/09/2018  . Vaginal delivery 10/07/2018  . Normal labor 11/04/2015  . Thyroid cancer ([place]) 06/18/2014  . 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 Postpartum care following vaginal delivery (11/04/15) 11/05/2012  . SVD (spontaneous vaginal delivery) 11/05/2012    Hospital course:  Onset of Labor With Vaginal Delivery     33 y.o. yo G3P3003 at [redacted]w[redacted]d was admitted in Active Labor on 10/07/2018. Patient had an uncomplicated labor course as follows:  Membrane Rupture Time/Date: 8:23 AM ,10/07/2018   Intrapartum Procedures: Episiotomy: None [1]                                         Lacerations:  None [1]  Patient had a delivery of a Viable  infant. 10/07/2018  Information for the patient's newborn:  Adelaine, Roppolo [[id number]]  Delivery Method: Vaginal, Spontaneous(Filed from Delivery Summary)    Pateint had an uncomplicated postpartum course.  She is ambulating, tolerating a regular diet, passing flatus, and urinating well. Patient is discharged home in stable condition on 10/09/18.  Postpartum Day # 2 : S/P NSVD due to spontaneous normal labor with 1. Thyroid papillary carcinoma 2016, s/p thyroidectomy, on syntroid 137 mcg daily, followed by Dr Altheimer. GBS+ and not treated due to fast delivery when pt arrived. Patient up ad lib, denies syncope or dizziness. Reports consuming regular diet without issues and denies N/V. Patient reports 0 bowel movement + passing flatus.  Denies issues with urination and reports bleeding is "lighter."  Patient is breastfeeding and reports going well.  Desires vasectomy for husband for postpartum contraception.  Pain is being appropriately managed with use of po meds.   Physical exam  Vitals:   10/08/18 9390 10/08/18 1340 10/08/18 2215 10/09/18 3009  BP: 119/81 117/80 118/80 125/85  Pulse: 71 67 70 88  Resp: 18 16 18 16   Temp: 98.3 F (36.8 C) 99.3 F (37.4 C) 97.7 F (36.5 C) 97.8 F (36.6 C)  TempSrc: Oral Oral Oral Oral  SpO2:    99%  Weight:      Height:       General: alert, cooperative and no distress Lochia: appropriate Uterine Fundus: firm Perineum: Intact DVT Evaluation: No evidence of DVT seen on physical exam. Negative Homan's sign. No cords or calf tenderness. No significant calf/ankle edema.  Labs: Lab Results  Component Value Date   WBC 9.1 10/08/2018   HGB 12.8 10/08/2018   HCT 37.0 10/08/2018   MCV 88.1 10/08/2018   PLT 236 10/08/2018   CMP Latest Ref Rng & Units 10/07/2018  Glucose 70 - 99 mg/dL 113(H)  BUN 6 - 20 mg/dL <5(L)  Creatinine 0.44 - 1.00 mg/dL 0.65  Sodium 135 - 145 mmol/L 136  Potassium 3.5 - 5.1 mmol/L 3.9  Chloride 98 - 111 mmol/L  102  CO2 22 - 32 mmol/L 24  Calcium 8.9 - 10.3 mg/dL 8.6(L)  Total Protein 6.5 - 8.1 g/dL 6.1(L)  Total Bilirubin 0.3 - 1.2 mg/dL 0.8  Alkaline Phos 38 - 126 U/L 672(H)  AST 15 - 41 U/L 25  ALT 0 - 44 U/L 26    Date of discharge: 10/09/2018 Discharge Diagnoses: Term Pregnancy-delivered Discharge instruction: per After Visit Summary and "Baby and Me Booklet".  After visit meds:   Activity:           unrestricted and pelvic rest Advance as tolerated. Pelvic rest for 6 weeks.  Diet:                routine Medications: PNV and Ibuprofen, and synthorid.  Postpartum contraception: Vasectomy Condition:  Pt discharge to home with baby in stable 1. Thyroid papillary carcinoma 2016, s/p thyroidectomy, on syntroid 137 mcg daily, followed by Dr Altheimer.  Discharge Follow Up:  [facility] Follow up in 6 week(s).   Specialty:  Obstetrics and Gynecology Contact information: [address] [place] [address] [place], NP-C, CNM 10/09/2018, 10:59 AM  [name], [place]

## 2019-08-26 ENCOUNTER — Ambulatory Visit: Payer: 59 | Attending: Internal Medicine

## 2019-08-26 DIAGNOSIS — Z23 Encounter for immunization: Secondary | ICD-10-CM

## 2019-08-26 NOTE — Progress Notes (Signed)
   Covid-19 Vaccination Clinic  Name:  Krystal Jefferson    MRN: [id number] DOB: 1986-04-08  08/26/2019  [name] was observed post Covid-19 immunization for 15 minutes without incident. She was provided with Vaccine Information Sheet and instruction to access the V-Safe system.   [name] was instructed to call 911 with any severe reactions post vaccine: Marland Kitchen Difficulty breathing  . Swelling of face and throat  . A fast heartbeat  . A bad rash all over body  . Dizziness and weakness   Immunizations Administered    Name Date Dose VIS Date Route   Pfizer COVID-19 Vaccine 08/26/2019  2:55 PM 0.3 mL 05/05/2019 Intramuscular   Manufacturer: [place]   Lot: [id number]   [place]: [id number]

## 2019-09-20 ENCOUNTER — Ambulatory Visit: Payer: 59 | Attending: Internal Medicine

## 2019-09-20 DIAGNOSIS — Z23 Encounter for immunization: Secondary | ICD-10-CM

## 2019-09-20 NOTE — Progress Notes (Signed)
   Covid-19 Vaccination Clinic  Name:  Krystal Jefferson    MRN: [id number] DOB: 05/31/1985  09/20/2019  Krystal Jefferson was observed post Covid-19 immunization for 15 minutes without incident. She was provided with Vaccine Information Sheet and instruction to access the V-Safe system.   Krystal Jefferson was instructed to call 911 with any severe reactions post vaccine: Marland Kitchen Difficulty breathing  . Swelling of face and throat  . A fast heartbeat  . A bad rash all over body  . Dizziness and weakness   Immunizations Administered    Name Date Dose VIS Date Route   Pfizer COVID-19 Vaccine 09/20/2019  3:32 PM 0.3 mL 07/19/2018 Intramuscular   Manufacturer: [place]   Lot: [id number]   [place]: [id number]

## 2020-11-05 LAB — OB RESULTS CONSOLE GC/CHLAMYDIA
Chlamydia: NEGATIVE
Gonorrhea: NEGATIVE

## 2020-11-05 LAB — OB RESULTS CONSOLE RPR: RPR: NONREACTIVE

## 2020-11-05 LAB — HEPATITIS C ANTIBODY: HCV Ab: NEGATIVE

## 2020-11-05 LAB — OB RESULTS CONSOLE HIV ANTIBODY (ROUTINE TESTING): HIV: NONREACTIVE

## 2020-11-05 LAB — OB RESULTS CONSOLE HEPATITIS B SURFACE ANTIGEN: Hepatitis B Surface Ag: NEGATIVE

## 2020-11-05 LAB — OB RESULTS CONSOLE RUBELLA ANTIBODY, IGM: Rubella: IMMUNE

## 2021-05-25 NOTE — L&D Delivery Note (Signed)
Delivery Note   Patient Name: Krystal Jefferson DOB: [birth year] MRN: [id number]  Date of admission: 06/08/2021 Delivering MD: [name]  Date of delivery: 06/08/21 Type of delivery: SVD  Newborn Data: Live born female  Birth Weight:   APGAR: 34, 54   Newborn Delivery   Birth date/time: 06/08/2021 03:24:00 Delivery type: Vaginal, Spontaneous     [name], 36 y.o., @ [redacted]w[redacted]d,  [phone], who was admitted for spontaneous active labor at 5.5cm, gbs+, PCN hanging, SROM clear at 0120. I was called to the room when she progressed 2+ station in the second stage of labor.  She pushed for 10/min.  She delivered a viable infant, cephalic and restituted to the ROA position over an intact perineum.  A nuchal cord   was not identified. The baby was placed on maternal abdomen while initial step of NRP were perfmored (Dry, Stimulated, and warmed). Hat placed on baby for thermoregulation. Delayed cord clamping was performed for 2.5 minutes.  Cord double clamped and cut.  Cord cut by FOB. Apgar scores were 9 and 9. Prophylactic Pitocin was started in the third stage of labor for active management. The placenta delivered spontaneously, shultz, with a 3 vessel cord and was sent to LD.  Inspection revealed 1st degree and right labial. An examination of the vaginal vault and cervix was free from lacerations. The uterus was firm, bleeding stable.  The repair was done under epidural.   Placenta and umbilical artery blood gas were not sent.  There were no complications during the procedure.  Mom and baby skin to skin following delivery. Left in stable condition.  Maternal Info: Anesthesia: Epidural Episiotomy: No Lacerations:  1st and right labial Suture Repair: 3.0 SH vicryl Est. Blood Loss (mL):  64mls  Newborn Info:  Baby Sex: female Circumcision: in pt circ Babies Name: Jude APGAR (1 MIN): 9, 9   APGAR (5 MINS):   APGAR (10 MINS):     Mom to postpartum.  Baby to Couplet care / Skin to  Skin.   [place], [place], NP-C 06/08/21 4:48 AM

## 2021-05-27 LAB — OB RESULTS CONSOLE GBS: GBS: POSITIVE

## 2021-06-08 ENCOUNTER — Inpatient Hospital Stay (HOSPITAL_COMMUNITY)
Admission: AD | Admit: 2021-06-08 | Discharge: 2021-06-10 | DRG: 807 | Disposition: A | Discharge status: Home / Self Care | Payer: 59 | Attending: Obstetrics & Gynecology | Admitting: Obstetrics & Gynecology

## 2021-06-08 ENCOUNTER — Inpatient Hospital Stay (HOSPITAL_COMMUNITY): Payer: 59 | Admitting: Anesthesiology

## 2021-06-08 ENCOUNTER — Encounter (HOSPITAL_COMMUNITY): Payer: Self-pay | Admitting: Obstetrics and Gynecology

## 2021-06-08 ENCOUNTER — Other Ambulatory Visit: Payer: Self-pay

## 2021-06-08 DIAGNOSIS — Z3A38 38 weeks gestation of pregnancy: Secondary | ICD-10-CM

## 2021-06-08 DIAGNOSIS — O26893 Other specified pregnancy related conditions, third trimester: Secondary | ICD-10-CM | POA: Diagnosis present

## 2021-06-08 DIAGNOSIS — R030 Elevated blood-pressure reading, without diagnosis of hypertension: Secondary | ICD-10-CM | POA: Diagnosis present

## 2021-06-08 DIAGNOSIS — Z20822 Contact with and (suspected) exposure to covid-19: Secondary | ICD-10-CM | POA: Diagnosis present

## 2021-06-08 DIAGNOSIS — Z8669 Personal history of other diseases of the nervous system and sense organs: Secondary | ICD-10-CM | POA: Diagnosis not present

## 2021-06-08 DIAGNOSIS — Z85850 Personal history of malignant neoplasm of thyroid: Secondary | ICD-10-CM

## 2021-06-08 DIAGNOSIS — O3663X0 Maternal care for excessive fetal growth, third trimester, not applicable or unspecified: Secondary | ICD-10-CM | POA: Diagnosis present

## 2021-06-08 DIAGNOSIS — O99284 Endocrine, nutritional and metabolic diseases complicating childbirth: Secondary | ICD-10-CM | POA: Diagnosis present

## 2021-06-08 DIAGNOSIS — O99824 Streptococcus B carrier state complicating childbirth: Secondary | ICD-10-CM | POA: Diagnosis present

## 2021-06-08 DIAGNOSIS — E559 Vitamin D deficiency, unspecified: Secondary | ICD-10-CM | POA: Diagnosis present

## 2021-06-08 LAB — CBC
HCT: 39.8 % (ref 36.0–46.0)
HCT: 39.8 % (ref 36.0–46.0)
Hemoglobin: 14.2 g/dL (ref 12.0–15.0)
Hemoglobin: 13.6 g/dL (ref 12.0–15.0)
MCH: 30.8 pg (ref 26.0–34.0)
MCH: 30.2 pg (ref 26.0–34.0)
MCHC: 35.7 g/dL (ref 30.0–36.0)
MCHC: 34.2 g/dL (ref 30.0–36.0)
MCV: 86.3 fL (ref 80.0–100.0)
MCV: 88.2 fL (ref 80.0–100.0)
Platelets: 279 10*3/uL (ref 150–400)
Platelets: 298 10*3/uL (ref 150–400)
RBC: 4.61 MIL/uL (ref 3.87–5.11)
RBC: 4.51 MIL/uL (ref 3.87–5.11)
RDW: 12.2 % (ref 11.5–15.5)
RDW: 12.4 % (ref 11.5–15.5)
WBC: 7.9 10*3/uL (ref 4.0–10.5)
WBC: 14.6 10*3/uL — ABNORMAL HIGH (ref 4.0–10.5)
nRBC: 0 % (ref 0.0–0.2)
nRBC: 0 % (ref 0.0–0.2)

## 2021-06-08 LAB — COMPREHENSIVE METABOLIC PANEL
ALT: 15 U/L (ref 0–44)
AST: 23 U/L (ref 15–41)
Albumin: 2.6 g/dL — ABNORMAL LOW (ref 3.5–5.0)
Alkaline Phosphatase: 113 U/L (ref 38–126)
Anion gap: 12 (ref 5–15)
BUN: 6 mg/dL (ref 6–20)
CO2: 21 mmol/L — ABNORMAL LOW (ref 22–32)
Calcium: 8.8 mg/dL — ABNORMAL LOW (ref 8.9–10.3)
Chloride: 105 mmol/L (ref 98–111)
Creatinine, Ser: 0.66 mg/dL (ref 0.44–1.00)
GFR, Estimated: >60 mL/min (ref >60)
Glucose, Bld: 92 mg/dL (ref 70–99)
Potassium: 3.9 mmol/L (ref 3.5–5.1)
Sodium: 138 mmol/L (ref 135–145)
Total Bilirubin: 0.8 mg/dL (ref 0.3–1.2)
Total Protein: 6 g/dL — ABNORMAL LOW (ref 6.5–8.1)

## 2021-06-08 LAB — PROTEIN / CREATININE RATIO, URINE
Creatinine, Urine: 36.72 mg/dL
Total Protein, Urine: <6 mg/dL

## 2021-06-08 LAB — TYPE AND SCREEN
ABO/RH(D): A POS
Antibody Screen: NEGATIVE

## 2021-06-08 LAB — RESP PANEL BY RT-PCR (FLU A&B, COVID) ARPGX2
Influenza A by PCR: NEGATIVE
Influenza B by PCR: NEGATIVE
SARS Coronavirus 2 by RT PCR: NEGATIVE

## 2021-06-08 LAB — TSH: TSH: 1.422 u[IU]/mL (ref 0.350–4.500)

## 2021-06-08 LAB — RPR: RPR Ser Ql: NONREACTIVE

## 2021-06-08 LAB — URIC ACID: Uric Acid, Serum: 7.1 mg/dL (ref 2.5–7.1)

## 2021-06-08 LAB — T4, FREE: Free T4: 1.04 ng/dL (ref 0.61–1.12)

## 2021-06-08 LAB — LACTATE DEHYDROGENASE: LDH: 223 U/L — ABNORMAL HIGH (ref 98–192)

## 2021-06-08 MED ORDER — OXYCODONE-ACETAMINOPHEN 5-325 MG PO TABS: 1.0000 | ORAL_TABLET | ORAL | Status: DC | PRN

## 2021-06-08 MED ORDER — LACTATED RINGERS IV SOLN
500.0000 mL | Freq: Once | INTRAVENOUS | Status: DC
Start: 2021-06-08 — End: 2021-06-08

## 2021-06-08 MED ORDER — COCONUT OIL OIL: 1.0000 "application " | TOPICAL_OIL | Status: DC | PRN

## 2021-06-08 MED ORDER — EPHEDRINE 5 MG/ML INJ: 10.0000 mg | INTRAVENOUS | Status: DC | PRN

## 2021-06-08 MED ORDER — PRENATAL MULTIVITAMIN CH
1.0000 | ORAL_TABLET | Freq: Every day | ORAL | Status: DC
  Administered 2021-06-08 – 2021-06-09 (×2): 1 via ORAL
  Filled 2021-06-08 (×2): qty 1

## 2021-06-08 MED ORDER — FENTANYL-BUPIVACAINE-NACL 0.5-0.125-0.9 MG/250ML-% EP SOLN
12.0000 mL/h | EPIDURAL | Status: DC | PRN
  Administered 2021-06-08: 12 mL/h via EPIDURAL

## 2021-06-08 MED ORDER — BENZOCAINE-MENTHOL 20-0.5 % EX AERO
1.0000 "application " | INHALATION_SPRAY | CUTANEOUS | Status: DC | PRN
  Filled 2021-06-08: qty 56

## 2021-06-08 MED ORDER — SODIUM CHLORIDE 0.9 % IV SOLN
5.0000 10*6.[IU] | Freq: Once | INTRAVENOUS | Status: AC
  Administered 2021-06-08: 5 10*6.[IU] via INTRAVENOUS
  Filled 2021-06-08: qty 5

## 2021-06-08 MED ORDER — PHENYLEPHRINE 40 MCG/ML (10ML) SYRINGE FOR IV PUSH (FOR BLOOD PRESSURE SUPPORT)
PREFILLED_SYRINGE | INTRAVENOUS | Status: AC
  Filled 2021-06-08: qty 10

## 2021-06-08 MED ORDER — PHENYLEPHRINE 40 MCG/ML (10ML) SYRINGE FOR IV PUSH (FOR BLOOD PRESSURE SUPPORT): 80.0000 ug | PREFILLED_SYRINGE | INTRAVENOUS | Status: DC | PRN

## 2021-06-08 MED ORDER — TETANUS-DIPHTH-ACELL PERTUSSIS 5-2.5-18.5 LF-MCG/0.5 IM SUSY: 0.5000 mL | PREFILLED_SYRINGE | Freq: Once | INTRAMUSCULAR | Status: DC

## 2021-06-08 MED ORDER — SOD CITRATE-CITRIC ACID 500-334 MG/5ML PO SOLN: 30.0000 mL | ORAL | Status: DC | PRN

## 2021-06-08 MED ORDER — ONDANSETRON HCL 4 MG/2ML IJ SOLN: 4.0000 mg | INTRAMUSCULAR | Status: DC | PRN

## 2021-06-08 MED ORDER — ONDANSETRON HCL 4 MG/2ML IJ SOLN: 4.0000 mg | Freq: Four times a day (QID) | INTRAMUSCULAR | Status: DC | PRN

## 2021-06-08 MED ORDER — DIPHENHYDRAMINE HCL 50 MG/ML IJ SOLN: 12.5000 mg | INTRAMUSCULAR | Status: DC | PRN

## 2021-06-08 MED ORDER — LIDOCAINE HCL (PF) 1 % IJ SOLN
INTRAMUSCULAR | Status: DC | PRN
  Administered 2021-06-08: 3 mL via EPIDURAL
  Administered 2021-06-08: 2 mL via EPIDURAL
  Administered 2021-06-08: 5 mL via EPIDURAL

## 2021-06-08 MED ORDER — SIMETHICONE 80 MG PO CHEW: 80.0000 mg | CHEWABLE_TABLET | ORAL | Status: DC | PRN

## 2021-06-08 MED ORDER — ZOLPIDEM TARTRATE 5 MG PO TABS: 5.0000 mg | ORAL_TABLET | Freq: Every evening | ORAL | Status: DC | PRN

## 2021-06-08 MED ORDER — OXYCODONE-ACETAMINOPHEN 5-325 MG PO TABS: 2.0000 | ORAL_TABLET | ORAL | Status: DC | PRN

## 2021-06-08 MED ORDER — LEVOTHYROXINE SODIUM 137 MCG PO TABS
137.0000 ug | ORAL_TABLET | Freq: Every day | ORAL | Status: DC
  Filled 2021-06-08 (×3): qty 1

## 2021-06-08 MED ORDER — PENICILLIN G POT IN DEXTROSE 60000 UNIT/ML IV SOLN: 3.0000 10*6.[IU] | INTRAVENOUS | Status: DC

## 2021-06-08 MED ORDER — IBUPROFEN 600 MG PO TABS
600.0000 mg | ORAL_TABLET | Freq: Four times a day (QID) | ORAL | Status: DC
  Administered 2021-06-08 – 2021-06-10 (×9): 600 mg via ORAL
  Filled 2021-06-08 (×9): qty 1

## 2021-06-08 MED ORDER — ACETAMINOPHEN 325 MG PO TABS: 650.0000 mg | ORAL_TABLET | ORAL | Status: DC | PRN

## 2021-06-08 MED ORDER — FENTANYL CITRATE (PF) 100 MCG/2ML IJ SOLN: 50.0000 ug | INTRAMUSCULAR | Status: DC | PRN

## 2021-06-08 MED ORDER — LIDOCAINE HCL (PF) 1 % IJ SOLN: 30.0000 mL | INTRAMUSCULAR | Status: DC | PRN

## 2021-06-08 MED ORDER — LACTATED RINGERS IV SOLN: INTRAVENOUS | Status: DC

## 2021-06-08 MED ORDER — ONDANSETRON HCL 4 MG PO TABS: 4.0000 mg | ORAL_TABLET | ORAL | Status: DC | PRN

## 2021-06-08 MED ORDER — WITCH HAZEL-GLYCERIN EX PADS: 1.0000 "application " | MEDICATED_PAD | CUTANEOUS | Status: DC | PRN

## 2021-06-08 MED ORDER — OXYTOCIN BOLUS FROM INFUSION
333.0000 mL | Freq: Once | INTRAVENOUS | Status: AC
  Administered 2021-06-08: 333 mL via INTRAVENOUS

## 2021-06-08 MED ORDER — OXYTOCIN-SODIUM CHLORIDE 30-0.9 UT/500ML-% IV SOLN
2.5000 [IU]/h | INTRAVENOUS | Status: DC
  Administered 2021-06-08: 2.5 [IU]/h via INTRAVENOUS
  Filled 2021-06-08: qty 500

## 2021-06-08 MED ORDER — LACTATED RINGERS IV SOLN
500.0000 mL | Freq: Once | INTRAVENOUS | Status: AC
  Administered 2021-06-08: 500 mL via INTRAVENOUS

## 2021-06-08 MED ORDER — FENTANYL-BUPIVACAINE-NACL 0.5-0.125-0.9 MG/250ML-% EP SOLN
EPIDURAL | Status: AC
  Filled 2021-06-08: qty 250

## 2021-06-08 MED ORDER — DIPHENHYDRAMINE HCL 25 MG PO CAPS: 25.0000 mg | ORAL_CAPSULE | Freq: Four times a day (QID) | ORAL | Status: DC | PRN

## 2021-06-08 MED ORDER — SENNOSIDES-DOCUSATE SODIUM 8.6-50 MG PO TABS
2.0000 | ORAL_TABLET | Freq: Every day | ORAL | Status: DC
  Administered 2021-06-09: 2 via ORAL
  Filled 2021-06-08: qty 2

## 2021-06-08 MED ORDER — DIBUCAINE (PERIANAL) 1 % EX OINT: 1.0000 "application " | TOPICAL_OINTMENT | CUTANEOUS | Status: DC | PRN

## 2021-06-08 MED ORDER — LACTATED RINGERS IV SOLN: 500.0000 mL | INTRAVENOUS | Status: DC | PRN

## 2021-06-08 NOTE — Anesthesia Preprocedure Evaluation (Signed)
Anesthesia Evaluation  Patient identified by MRN, date of birth, ID band Patient awake    Reviewed: Allergy & Precautions, NPO status , Patient's Chart, lab work & pertinent test results  Airway Mallampati: II  TM Distance: >3 FB Neck ROM: Full    Dental  (+) Teeth Intact, Dental Advisory Given   Pulmonary neg pulmonary ROS,    Pulmonary exam normal breath sounds clear to auscultation       Cardiovascular negative cardio ROS Normal cardiovascular exam Rhythm:Regular Rate:Normal     Neuro/Psych Seizures -, Well Controlled,     GI/Hepatic negative GI ROS, Neg liver ROS,   Endo/Other  Hypothyroidism   Renal/GU negative Renal ROS     Musculoskeletal negative musculoskeletal ROS (+)   Abdominal   Peds  Hematology negative hematology ROS (+) Plt 279k   Anesthesia Other Findings Day of surgery medications reviewed with the patient.  Reproductive/Obstetrics (+) Pregnancy                             Anesthesia Physical Anesthesia Plan  ASA: 2  Anesthesia Plan: Epidural   Post-op Pain Management:    Induction:   PONV Risk Score and Plan: 2 and Treatment may vary due to age or medical condition  Airway Management Planned: Natural Airway  Additional Equipment:   Intra-op Plan:   Post-operative Plan:   Informed Consent: I have reviewed the patients History and Physical, chart, labs and discussed the procedure including the risks, benefits and alternatives for the proposed anesthesia with the patient or authorized representative who has indicated his/her understanding and acceptance.     Dental advisory given  Plan Discussed with:   Anesthesia Plan Comments: (Patient identified. Risks/Benefits/Options discussed with patient including but not limited to bleeding, infection, nerve damage, paralysis, failed block, incomplete pain control, headache, blood pressure changes, nausea,  vomiting, reactions to medication both or allergic, itching and postpartum back pain. Confirmed with bedside nurse the patient's most recent platelet count. Confirmed with patient that they are not currently taking any anticoagulation, have any bleeding history or any family history of bleeding disorders. Patient expressed understanding and wished to proceed. All questions were answered. )        Anesthesia Quick Evaluation

## 2021-06-08 NOTE — Progress Notes (Signed)
Patient ambulated to bathroom first time after using a stedy this morning. Patient independent with nurse at bedside in case patient desires assistance. Patient able to use restroom and perform cares independently.

## 2021-06-08 NOTE — Anesthesia Postprocedure Evaluation (Signed)
Anesthesia Post Note  Patient: Krystal Jefferson  Procedure(s) Performed: AN AD HOC LABOR EPIDURAL     Patient location during evaluation: Mother Baby Anesthesia Type: Epidural Level of consciousness: awake Pain management: satisfactory to patient Vital Signs Assessment: post-procedure vital signs reviewed and stable Respiratory status: spontaneous breathing Cardiovascular status: stable Anesthetic complications: no   No notable events documented.  Last Vitals:  Vitals:   06/08/21 0600 06/08/21 0715  BP: 126/80 121/79  Pulse: 74 68  Resp: 18 18  Temp: 37.2 C 36.8 C  SpO2: 97%     Last Pain:  Vitals:   06/08/21 0715  TempSrc: Oral  PainSc: 0-No pain   Pain Goal:                   [employer]

## 2021-06-08 NOTE — MAU Note (Signed)
Pt reports ctx started about 1 hr ago. Have gotten very intense q 5 min, Denies any vag bleeding or leaking. Good fetal movement felt.

## 2021-06-08 NOTE — Lactation Note (Signed)
This note was copied from a baby's chart. Lactation Consultation Note  Patient Name: Boy Christian Treadway Today's Date: 06/08/2021 Reason for consult: L&D Initial assessment;Early term 37-38.6wks Age:37 hours   Initial L&D Consult:  Visited with family < 1 hour after birth Assisted to latch baby easily, however, he was sleepy; few sucks only.  Placed STS on mother's chest.  Reassured mother that lactation services will be provided on the M/B unit.  Allowed time for family bonding.  Father at bedside.  Experienced breast feeding mother.   Maternal Data    Feeding Mother's Current Feeding Choice: Breast Milk  LATCH Score Latch: Repeated attempts needed to sustain latch, nipple held in mouth throughout feeding, stimulation needed to elicit sucking reflex.  Audible Swallowing: None  Type of Nipple: Everted at rest and after stimulation  Comfort (Breast/Nipple): Soft / non-tender  Hold (Positioning): Assistance needed to correctly position infant at breast and maintain latch.  LATCH Score: 6   Lactation Tools Discussed/Used    Interventions Interventions: Assisted with latch;Skin to skin  Discharge    Consult Status Consult Status: Follow-up from L&D    Neylan Koroma R Tawan Corkern 06/08/2021, 4:15 AM

## 2021-06-08 NOTE — MAU Note (Signed)
Pt reports feeling fluid leaking-clear fluid noted running from vagina.

## 2021-06-08 NOTE — H&P (Addendum)
Krystal Jefferson is a 36 y.o. female, G4P3003, IUP at 38.3 weeks, presenting for spontaneous active labor with SROM clear at [facility] 1/15. GBS+. EFW 5.2 on 04/25/2021, AFI 17.6, vertex. Had elevated BP in MAU no dx, denies HA, RUQ pain or vision changes. Pt endorse + Fm. Denies vaginal bleeding.   Pregnancy Problems advanced maternal age gravida (Declined genetics)  Group B Streptococcus carrier (RX in labor) history of malignant neoplasm of thyroid (2016, ablation and thyroidectomy due to papillary cancer, on Synthroid replacement, 16mcg daily. 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   Postpartum care following vaginal delivery (11/04/15) 11/05/2012   SVD (spontaneous vaginal delivery) 11/05/2012     Active Ambulatory Problems    Diagnosis Date Noted   Postpartum care following vaginal delivery (11/04/15) 11/05/2012   SVD (spontaneous vaginal delivery) 11/05/2012   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   06/07/2021    Cholecalciferol 100 MCG (4000 UT) CAPS Take 1 capsule by mouth daily.    06/07/2021   levothyroxine (SYNTHROID) 137 MCG tablet Take 1 tablet (137 mcg total) by mouth daily before breakfast. 30 tablet 0 06/07/2021   Prenatal Vit-Fe Fumarate-FA (PRENATAL MULTIVITAMIN) TABS tablet Take 1 tablet by mouth daily at 12 noon.   06/07/2021   ibuprofen (ADVIL) 600 MG tablet Take 1 tablet (600 mg total) by mouth every 6 (six) hours. 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   Current Outpatient Medications on File Prior to Encounter  Medication Sig Dispense Refill   Calcium Carb-Cholecalciferol (CALCIUM + D3) 600-200 MG-UNIT TABS Take 2 tablets by mouth daily.     Cholecalciferol 100 MCG (4000 UT) CAPS Take 1 capsule by mouth daily.      levothyroxine (SYNTHROID) 137 MCG tablet Take 1 tablet (137 mcg total) by mouth daily before breakfast. 30 tablet 0   Prenatal Vit-Fe Fumarate-FA (PRENATAL MULTIVITAMIN) TABS tablet Take 1 tablet by mouth daily at 12 noon.     ibuprofen (ADVIL) 600 MG tablet Take 1 tablet (600 mg total) by mouth every 6 (six) hours. 30 tablet 0     No Known Allergies  History of present pregnancy: Pt Info/Preference:  Screening/Consents:  Labs:   EDD: Estimated Date of Delivery: 06/19/21  Establised: No LMP recorded. Patient is pregnant.  Anatomy Scan: Date: 02/25/2021 Placenta Location: posterior Genetic Screen: [place]:Declined AFP:  First Tri: Quad:  Office: [facility]           First PNV: 11.4 weeks Blood Type  A+  Language: english Last PNV: 37.4 weeks Rhogam    Flu Vaccine:  UTD   Antibody  Neg  TDaP vaccine UTD   GTT: Early: N/A Third Trimester: 100  Feeding Plan: Breast BTL: no  Rubella:  Immune  Contraception: ??? VBAC: no RPR:   NR  Circumcision: In pt desired   HBsAg:  Neg  Pediatrician:  DR Susy Frizzle   HIV:   Neg  Prenatal Classes: no Additional [place]: Yess see HPI GBS: Positive/-- (01/03 0000)(For PCN allergy, check sensitivities)       Chlamydia: Neg    MFM Referral/Consult:  GC: neg  Support Person: Husband   PAP: ???  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[place])    Cancer ([place])    papillary thryoid   Seizures ([place])    last seizure age 52 or 64, no seizure meds since age 36 or 61   SVD (spontaneous vaginal delivery) 11/05/2012   Past Surgical History:  Procedure Laterality Date   NODE DISSECTION N/A 06/18/2014   Procedure: LIMITED NODE DISSECTION;  Surgeon: Armandina Gemma, MD;  Location: [facility];  Service: General;  Laterality: N/A;   THYROIDECTOMY N/A 06/18/2014   Procedure: TOTAL THYROIDECTOMY WITH LIMITED NODE DISSECTION;  Surgeon: Armandina Gemma, MD;  Location: [facility];  Service: General;  Laterality: N/A;   WISDOM TOOTH EXTRACTION     Family History: family history includes Cancer in her mother; Diverticulitis in her father. Social History:  reports that she has never smoked. She has never used smokeless tobacco. She reports that she does not currently use alcohol. She reports that she does not use drugs.   Prenatal Transfer Tool  Maternal Diabetes: No Genetic Screening: Declined Maternal Ultrasounds/Referrals: Normal Fetal Ultrasounds or other Referrals:  None Maternal Substance Abuse:  No Significant Maternal Medications:  None Significant Maternal Lab Results: Group B Strep positive  ROS:  Review of Systems   Constitutional: Negative.   HENT: Negative.    Eyes: Negative.   Respiratory: Negative.    Cardiovascular: Negative.  Negative for chest pain.  Gastrointestinal:  Positive for abdominal pain.  Genitourinary: Negative.        Vaginal leakage   Musculoskeletal: Negative.   Skin: Negative.   Neurological: Negative.   Endo/Heme/Allergies: Negative.   Psychiatric/Behavioral: Negative.      Physical Exam: BP (!) 154/94    Pulse 81    Temp 98.2 F (36.8 C)    Resp 18    Ht 5\' 9"  (1.753 m)    Wt 89.4 kg  BMI 29.09 kg/m   Physical Exam Constitutional:      Appearance: Normal appearance.  HENT:     Head: Normocephalic and atraumatic.     Nose: Nose normal.     Mouth/Throat:     Mouth: Mucous membranes are moist.  Eyes:     Pupils: Pupils are equal, round, and reactive to light.  Cardiovascular:     Rate and Rhythm: Normal rate and regular rhythm.     Pulses: Normal pulses.     Heart sounds: Normal heart sounds.  Pulmonary:     Effort: Pulmonary effort is normal.     Breath sounds: Normal breath sounds.  Abdominal:     General: Bowel sounds are normal.  Genitourinary:    Comments: Pelvis adequate for vaginal delivery , uterus gravida  Musculoskeletal:        General: Normal range of motion.     Cervical back: Normal range of motion and neck supple.  Skin:    General: Skin is warm.  Neurological:     General: No focal deficit present.     Mental Status: She is alert.  Psychiatric:        Mood and Affect: Mood normal.     NST: FHR baseline 145 bpm, Variability: moderate, Accelerations:present, Decelerations:  Absent= Cat 1/Reactive UC:   regular, every 2-4 minutes SVE:   Dilation: 5.5 Effacement (%): 80 Station: -2 Exam by:: Apolinar Junes RN, vertex verified by fetal sutures.  Leopold's: Position vertex, EFW 8lbs via leopold's.   Labs: No results found for this or any previous visit (from the past 24 hour(s)).  Imaging:  No results found.  MAU Course: Orders  Placed This Encounter  Procedures   OB RESULT CONSOLE Group B Strep   Cervical Exam   Insert urethral catheter X 1 PRN If Coude Catheter is chosen, qualified resources by campus can be found in the clinical skills nursing procedure for Coude Catheter 1. If straight catheterized > 2 times or patient unable to void post epidural plac...   No orders of the defined types were placed in this encounter.   Assessment/Plan: Krystal Jefferson is a 36 y.o. female, G4P3003, IUP at 38.3 weeks, presenting for spontaneous active labor with SROM clear at [facility] 1/15. GBS+. EFW 5.2 on 04/25/2021, AFI 17.6, vertex. Had elevated BP in MAU no dx, denies HA, RUQ pain or vision changes. Pt endorse + Fm. Denies vaginal bleeding.   Pregnancy Problems advanced maternal age gravida (Declined genetics)  Group B Streptococcus carrier (RX in labor) history of malignant neoplasm of thyroid (2016, ablation and thyroidectomy due to papillary cancer, on Synthroid replacement, 121mcg daily. Check TSH q trimester, followed at endocrinology.) history of miscarriage large for gestation age fetus recurrent benign focal seizures of childhood (Last seizure age 58-11, no meds since age [age].) vitamin D deficiency (Recommend supplementation, recheck PP.)  FWB: Cat 1 Fetal Tracing.   Plan: Admit to [facility]  H/O thyroid CA: Continue synthroid 127mcg daily, Pending TSH, Free T4 Routine [facility] orders Pain med/epidural prn PCN G for GBS prophylaxis  Elevated BP in MAI and LD no Dx: Pending Urine PCR, CMP, uric acid, LDH, CBC Anticipate labor progression   [name] NP-C, CNM, MSN 06/08/2021, 1:39 AM

## 2021-06-08 NOTE — Anesthesia Procedure Notes (Signed)
Epidural Patient location during procedure: OB Start time: 06/08/2021 2:20 AM End time: 06/08/2021 2:26 AM  Staffing Anesthesiologist: Catalina Gravel, MD Performed: anesthesiologist   Preanesthetic Checklist Completed: patient identified, IV checked, risks and benefits discussed, monitors and equipment checked, pre-op evaluation and timeout performed  Epidural Patient position: sitting Prep: DuraPrep Patient monitoring: blood pressure and continuous pulse ox Approach: midline Location: L3-L4 Injection technique: LOR air  Needle:  Needle type: Tuohy  Needle gauge: 17 G Needle length: 9 cm Needle insertion depth: 5 cm Catheter size: 19 Gauge Catheter at skin depth: 10 cm Test dose: negative and Other (1% Lidocaine)  Additional Notes Patient identified.  Risk benefits discussed including failed block, incomplete pain control, headache, nerve damage, paralysis, blood pressure changes, nausea, vomiting, reactions to medication both toxic or allergic, and postpartum back pain.  Patient expressed understanding and wished to proceed.  All questions were answered.  Sterile technique used throughout procedure and epidural site dressed with sterile barrier dressing. No paresthesia or other complications noted. The patient did not experience any signs of intravascular injection such as tinnitus or metallic taste in mouth nor signs of intrathecal spread such as rapid motor block. Please see nursing notes for vital signs. Reason for block:procedure for pain

## 2021-06-09 MED ORDER — IBUPROFEN 600 MG PO TABS: 600.0000 mg | ORAL_TABLET | Freq: Four times a day (QID) | ORAL | 0 refills | Status: DC

## 2021-06-09 NOTE — Discharge Summary (Incomplete Revision)
SVD OB Discharge Summary     Patient Name: Krystal Jefferson DOB: 1985-09-30 MRN: [id number]  Date of admission: 06/08/2021 Delivering MD: [name]  Date of delivery: 06/08/2021 Type of delivery: SVD  Newborn Data: Sex: Baby female Name: Jude Circumcision: in pt desired prior to discharge today Live born female  Birth Weight: 8 lb 6 oz (3800 g) APGAR: 1, 34  Newborn Delivery   Birth date/time: 06/08/2021 03:24:00 Delivery type: Vaginal, Spontaneous      Feeding: breast Infant being discharge to home with mother in stable condition.   Admitting diagnosis: Normal labor and delivery [O80] Intrauterine pregnancy: [redacted]w[redacted]d     Secondary diagnosis:  Principal Problem:   Normal labor and delivery Active Problems:   SVD (spontaneous vaginal delivery)   Normal postpartum course   Elevated BP without diagnosis of hypertension                                Complications: None                                                              Intrapartum Procedures: spontaneous vaginal delivery, GBS prophylaxis, and tx 1 dose  Postpartum Procedures: none Complications-Operative and Postpartum:  1st and right labial repaired degree perineal laceration Augmentation: N/A   History of Present Illness: [name] is a 36 y.o. female, [id number], who presents at [redacted]w[redacted]d weeks gestation. 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 yo [id number] at [redacted]w[redacted]d was admitted in Active Labor on 06/08/2021. Patient had an uncomplicated labor course as follows:  Membrane Rupture Time/Date: 1:20 AM ,06/08/2021   Delivery Method:Vaginal, Spontaneous  Episiotomy: None  Lacerations:  1st degree;Labial  Patient had an uncomplicated postpartum course.  She is ambulating, tolerating a regular diet, passing flatus, and urinating well. Patient is discharged home in stable condition on 06/09/21.  Newborn Data: Birth date:06/08/2021  Birth time:3:24 AM  Gender:Female  Living status:Living  Apgars:9 ,10  Weight:3800 g  Postpartum Day # 1 : Postpartum Day # 1 : S/P NSVD due to pt was admitted on 1/15 for spontaneous active labor at 5.5cm @ 38.3 weeks. GBS+ only tx with  1 dose PCN, H/O thyroid CA  s/p ablation and thyroidectomy, followed by endocrinology, WNL TSH and Free T4, on synthroid daily 127mcg. H/O focal seizure no meds, last seizure 36 yo. Had elevated BP in MAU no dx, denies HA, RUQ pain or vision changes, PCR undetected, PreE labs unremarkable, BP currently 118/80, normotensive since delivery. Pt progress to SVD on 1/15 @ 0324 over 1st degree laceration repaired with righ labial repair, ebl of 59mls, hgb drop of  13.2-14.6. Patient up ad lib, denies syncope or dizziness. Reports consuming regular diet without issues and denies N/V. Patient reports 0 bowel movement + passing flatus.  Denies issues with urination and reports bleeding is "lighter."  Patient is breastfeeding and reports going well.  Desires undecided  for postpartum contraception.  Pain is being appropriately managed with use of po  meds.  Desires discharge today.  Physical exam  Vitals:   06/08/21 1145 06/08/21 1538 06/08/21 2035 06/09/21 0555  BP: 112/70 119/76 125/81 118/80  Pulse: 65 70 76 72  Resp: 17 16 20 18   Temp: 98.1 F (36.7 C) 97.9 F (36.6 C) 97.6 F (36.4 C) 97.6 F (36.4 C)  TempSrc: Oral Oral Oral Oral  SpO2: 98% 98% 97% 99%  Weight:      Height:       General: alert, cooperative, and no distress Lochia: appropriate Uterine Fundus: firm Perineum: approximate DVT Evaluation: No evidence of DVT seen on physical exam. Negative Homan's sign. No cords or calf tenderness. No significant calf/ankle edema.  Labs: Lab Results  Component Value Date   WBC 14.6 (H) 06/08/2021   HGB 13.6 06/08/2021   HCT 39.8 06/08/2021   MCV 88.2 06/08/2021   PLT 298 06/08/2021   CMP Latest Ref Rng & Units 06/08/2021  Glucose 70 - 99 mg/dL 92  BUN 6 - 20 mg/dL 6  Creatinine 0.44 - 1.00 mg/dL 0.66  Sodium 135 - 145 mmol/L 138  Potassium 3.5 - 5.1 mmol/L 3.9  Chloride 98 - 111 mmol/L 105  CO2 22 - 32 mmol/L 21(L)  Calcium 8.9 - 10.3 mg/dL 8.8(L)  Total Protein 6.5 - 8.1 g/dL 6.0(L)  Total Bilirubin 0.3 - 1.2 mg/dL 0.8  Alkaline Phos 38 - 126 U/L 113  AST 15 - 41 U/L 23  ALT 0 - 44 U/L 15    Date of discharge: 06/09/2021 Discharge Diagnoses: Term Pregnancy-delivered Discharge instruction: per After Visit Summary and "Baby and Me Booklet".  After visit meds:   Activity:           unrestricted and pelvic rest Advance as tolerated. Pelvic rest for 6 weeks.  Diet:                routine Medications: PNV and Ibuprofen Postpartum contraception: Undecided Condition:  Pt discharge to home with baby in stable and condition H/O thyroid ca: continue 137 mcg synthroid, f/u within 1 week appointment already made.    Meds: Allergies as of 06/09/2021   No Known Allergies      Medication List     TAKE these medications     Calcium + D3 600-200 MG-UNIT Tabs Take 2 tablets by mouth daily.   Cholecalciferol 100 MCG (4000 UT) Caps Take 1 capsule by mouth daily.   ibuprofen 600 MG tablet Commonly known as: ADVIL Take 1 tablet (600 mg total) by mouth every 6 (six) hours. What changed: Another medication with the same name was added. Make sure you understand how and when to take each.  ibuprofen 600 MG tablet Commonly known as: ADVIL Take 1 tablet (600 mg total) by mouth every 6 (six) hours. What changed: You were already taking a medication with the same name, and this prescription was added. Make sure you understand how and when to take each.   levothyroxine 137 MCG tablet Commonly known as: SYNTHROID Take 1 tablet (137 mcg total) by mouth daily before breakfast.   prenatal multivitamin Tabs tablet Take 1 tablet by mouth daily at 12 noon.        Discharge Follow Up:   Follow-up [facility]. Schedule an appointment as soon as possible for a visit in 6 week(s).   Specialty: Obstetrics and Gynecology Contact information: [address] [place] [address] [place], NP-C, CNM 06/09/2021, 9:22 AM  [name], [place]

## 2021-06-09 NOTE — Progress Notes (Addendum)
Subjective: Postpartum Day # 1 : S/P NSVD due to pt was admitted on 1/15 for spontaneous active labor at 5.5cm @ 38.3 weeks. GBS+ only tx with 1 dose PCN, H/O thyroid CA  s/p ablation and thyroidectomy, followed by endocrinology, WNL TSH and Free T4, on synthroid daily 11mcg. H/O focal seizure no meds, last seizure 36 yo. Had elevated BP in MAU no dx, denies HA, RUQ pain or vision changes, PCR undetected, PreE labs unremarkable, BP currently 118/80, normotensive since delivery. Pt progress to SVD on 1/15 @ 0324 over 1st degree laceration repaired with righ labial repair, ebl of 45mls, hgb drop of  13.2-14.6. Patient up ad lib, denies syncope or dizziness. Reports consuming regular diet without issues and denies N/V. Patient reports 0 bowel movement + passing flatus.  Denies issues with urination and reports bleeding is "lighter."  Patient is breastfeeding and reports going well.  Desires undecided for postpartum contraception.  Pain is being appropriately managed with use of po  meds. Pt desired to be discharge but NB staying   1st degree laceration Feeding:  Breast Contraceptive plan:  undecided BB: Circ in pt desired  Objective: Vital signs in last 24 hours: Patient Vitals for the past 24 hrs:  BP Temp Temp src Pulse Resp SpO2  06/09/21 0555 118/80 97.6 F (36.4 C) Oral 72 18 99 %  06/08/21 2035 125/81 97.6 F (36.4 C) Oral 76 20 97 %  06/08/21 1538 119/76 97.9 F (36.6 C) Oral 70 16 98 %  06/08/21 1145 112/70 98.1 F (36.7 C) Oral 65 17 98 %     Physical Exam:  General: alert, cooperative, appears stated age, and no distress Mood/Affect: Happy Lungs: clear to auscultation, no wheezes, rales or rhonchi, symmetric air entry.  Heart: normal rate, regular rhythm, normal S1, S2, no murmurs, rubs, clicks or gallops. Breast: breasts appear normal, no suspicious masses, no skin or nipple changes or axillary nodes. Abdomen:  + bowel sounds, soft, non-tender GU: perineum approximate, healing  well. No signs of external hematomas.  Uterine Fundus: firm Lochia: appropriate Skin: Warm, Dry. DVT Evaluation: No evidence of DVT seen on physical exam. Negative Homan's sign. No cords or calf tenderness. No significant calf/ankle edema.  CBC Latest Ref Rng & Units 06/08/2021 06/08/2021 10/08/2018  WBC 4.0 - 10.5 K/uL 14.6(H) 7.9 9.1  Hemoglobin 12.0 - 15.0 g/dL 13.6 14.2 12.8  Hematocrit 36.0 - 46.0 % 39.8 39.8 37.0  Platelets 150 - 400 K/uL 298 279 236    No results found for this or any previous visit (from the past 24 hour(s)).   CBG (last 3)  No results for input(s): GLUCAP in the last 72 hours.   I/O last 3 completed shifts: In: 0  Out: 1057 [Urine:1000; Blood:57]   Assessment Postpartum Day # 1 : S/P NSVD due to pt was admitted on 1/15 for spontaneous active labor at 5.5cm @ 38.3 weeks. GBS+ only tx with 1 dose PCN, H/O thyroid CA  s/p ablation and thyroidectomy, followed by endocrinology, WNL TSH and Free T4, on synthroid daily 118mcg. H/O focal seizure no meds, last seizure 36 yo. Had elevated BP in MAU no dx, denies HA, RUQ pain or vision changes, PCR undetected, PreE labs unremarkable, BP currently 118/80, normotensive since delivery. Pt progress to SVD on 1/15 @ 0324 over 1st degree laceration repaired with righ labial repair, ebl of 65mls, hgb drop of  13.2-14.6. Pt stable. -1 involution. Breast feeding. Hemodynamically stable.   Plan: Continue other mgmt as  ordered H/O seizure: No meds, monitor for seizure activity, given 2mg  IV ativan if seizure noted and notified attending.  GBS+ with inadequate rx: Plans for discharge today at 36 hours  Elevated BP in MAU: Monitor BP, no dx, no meds.  H/O thyroid CA: Continue synthroid 137 mcg, follow endo PP.  VTE prophylactics: Early ambulated as tolerates.  Pain control: Motrin/Tylenol PRN Education given regarding options for contraception, including barrier methods, injectable contraception, IUD placement, oral contraceptives.   Plan for discharge tomorrow, Breastfeeding, Lactation consult, and Circumcision prior to discharge  Dr. Alesia Richards to be updated on patient status  [facility] NP-C, CNM 06/09/2021, 9:21 AM

## 2021-06-09 NOTE — Discharge Summary (Addendum)
SVD OB Discharge Summary     Patient Name: Krystal Jefferson DOB: 1986-03-03 MRN: [id number]  Date of admission: 06/08/2021 Delivering MD: [name]  Date of delivery: 06/08/2021 Type of delivery: SVD  Newborn Data: Sex: Baby female Name: Jude Circumcision: in pt done 1/16 Live born female  Birth Weight: 8 lb 6 oz (3800 g) APGAR: 41, 10  Newborn Delivery   Birth date/time: 06/08/2021 03:24:00 Delivery type: Vaginal, Spontaneous      Feeding: breast Infant being discharge to home with mother in stable condition.   Admitting diagnosis: Normal labor and delivery [O80] Intrauterine pregnancy: [redacted]w[redacted]d     Secondary diagnosis:  Principal Problem:   Normal labor and delivery Active Problems:   SVD (spontaneous vaginal delivery)   Normal postpartum course   Elevated BP without diagnosis of hypertension                                Complications: None                                                              Intrapartum Procedures: spontaneous vaginal delivery, GBS prophylaxis, and tx 1 dose  Postpartum Procedures: none Complications-Operative and Postpartum:  1st and right labial repaired degree perineal laceration Augmentation: N/A   History of Present Illness: [name] is a 36 y.o. female, [id number], who presents at [redacted]w[redacted]d weeks gestation. 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 yo [id number] at [redacted]w[redacted]d was admitted in Active Labor on 06/08/2021. Patient had an uncomplicated labor course as follows:  Membrane Rupture Time/Date: 1:20 AM ,06/08/2021   Delivery Method:Vaginal, Spontaneous  Episiotomy: None  Lacerations:  1st degree;Labial  Patient had an uncomplicated postpartum course.  She is ambulating, tolerating a regular diet, passing flatus, and urinating well. Patient is discharged home in stable condition on 06/10/21.  Newborn Data: Birth date:06/08/2021  Birth time:3:24 AM  Gender:Female  Living status:Living  Apgars:9 ,10  Weight:3800 g  Postpartum Day # 2: Postpartum Day # 1 : S/P NSVD due to pt was admitted on 1/15 for spontaneous active labor at 5.5cm @ 38.3 weeks. GBS+ only tx with 1 dose PCN, H/O  thyroid CA  s/p ablation and thyroidectomy, followed by endocrinology, WNL TSH and Free T4, on synthroid daily 176mcg. H/O focal seizure no meds, last seizure 36 yo. Had elevated BP in MAU no dx, denies HA, RUQ pain or vision changes, PCR undetected, PreE labs unremarkable, BP currently 118/80, normotensive since delivery. Pt progress to SVD on 1/15 @ 0324 over 1st degree laceration repaired with righ labial repair, ebl of 66mls, hgb drop of  13.2-14.6. Patient up ad lib, denies syncope or dizziness. Reports consuming regular diet without issues and denies N/V. Patient reports 0 bowel movement + passing flatus.  Denies issues with urination and reports bleeding is "lighter."  Patient is breastfeeding and reports going well.  Desires undecided for postpartum  contraception.  Pain is being appropriately managed with use of po  meds.  Desires discharge today.  Physical exam  Vitals:   06/09/21 0555 06/09/21 1248 06/10/21 0030 06/10/21 0520  BP: 118/80 125/81 122/83 119/81  Pulse: 72 67 74 72  Resp: 18 18 18 18   Temp: 97.6 F (36.4 C) 98.2 F (36.8 C) 98 F (36.7 C) 98.2 F (36.8 C)  TempSrc: Oral Oral Oral Oral  SpO2: 99%  100% 99%  Weight:      Height:       General: alert, cooperative, and no distress Lochia: appropriate Uterine Fundus: firm Perineum: approximate DVT Evaluation: No evidence of DVT seen on physical exam. Negative Homan's sign. No cords or calf tenderness. No significant calf/ankle edema.  Labs: Lab Results  Component Value Date   WBC 14.6 (H) 06/08/2021   HGB 13.6 06/08/2021   HCT 39.8 06/08/2021   MCV 88.2 06/08/2021   PLT 298 06/08/2021   CMP Latest Ref Rng & Units 06/08/2021  Glucose 70 - 99 mg/dL 92  BUN 6 - 20 mg/dL 6  Creatinine 0.44 - 1.00 mg/dL 0.66  Sodium 135 - 145 mmol/L 138  Potassium 3.5 - 5.1 mmol/L 3.9  Chloride 98 - 111 mmol/L 105  CO2 22 - 32 mmol/L 21(L)  Calcium 8.9 - 10.3 mg/dL 8.8(L)  Total Protein 6.5 - 8.1 g/dL 6.0(L)  Total Bilirubin 0.3 - 1.2 mg/dL 0.8  Alkaline Phos 38 - 126 U/L 113  AST 15 - 41 U/L 23  ALT 0 - 44 U/L 15    Date of discharge: 06/10/2021 Discharge Diagnoses: Term Pregnancy-delivered Discharge instruction: per After Visit Summary and "Baby and Me Booklet".  After visit meds:   Activity:           unrestricted and pelvic rest Advance as tolerated. Pelvic rest for 6 weeks.  Diet:                routine Medications: PNV and Ibuprofen Postpartum contraception: Undecided Condition:  Pt discharge to home with baby in stable and condition H/O thyroid ca: continue 137 mcg synthroid, f/u within 1 week appointment already made.    Meds: Allergies as of 06/10/2021   No Known Allergies      Medication List     TAKE these medications    Calcium + D3  600-200 MG-UNIT Tabs Take 2 tablets by mouth daily.   Cholecalciferol 100 MCG (4000 UT) Caps Take 1 capsule by mouth daily.   ibuprofen 600 MG tablet Commonly known as: ADVIL Take 1 tablet (600 mg total) by mouth every 6 (six) hours. What changed: Another medication with the same name was added. Make sure you understand how and when to take each.   ibuprofen 600 MG tablet  Commonly known as: ADVIL Take 1 tablet (600 mg total) by mouth every 6 (six) hours. What changed: You were already taking a medication with the same name, and this prescription was added. Make sure you understand how and when to take each.   levothyroxine 137 MCG tablet Commonly known as: SYNTHROID Take 1 tablet (137 mcg total) by mouth daily before breakfast.   prenatal multivitamin Tabs tablet Take 1 tablet by mouth daily at 12 noon.        Discharge Follow Up:   Follow-up [facility]. Schedule an appointment as soon as possible for a visit in 6 week(s).   Specialty: Obstetrics and Gynecology Contact information: [address] [place] [address] [place], NP-C, CNM 06/10/2021, [place] AM  [name], [place]

## 2021-06-23 ENCOUNTER — Telehealth (HOSPITAL_COMMUNITY): Payer: Self-pay | Admitting: *Deleted

## 2021-06-23 NOTE — Telephone Encounter (Signed)
Phone voicemail message left to return nurse call.  Odis Hollingshead, RN 06-13-21 at 10:47am

## 2021-09-08 ENCOUNTER — Other Ambulatory Visit: Payer: Self-pay

## 2021-09-08 ENCOUNTER — Observation Stay (HOSPITAL_COMMUNITY): Payer: 59

## 2021-09-08 ENCOUNTER — Emergency Department (HOSPITAL_COMMUNITY): Payer: 59

## 2021-09-08 ENCOUNTER — Encounter (HOSPITAL_COMMUNITY): Payer: Self-pay | Admitting: Internal Medicine

## 2021-09-08 ENCOUNTER — Observation Stay (HOSPITAL_COMMUNITY)
Admission: EM | Admit: 2021-09-08 | Discharge: 2021-09-09 | Disposition: A | Discharge status: Home / Self Care | Payer: 59 | Attending: Internal Medicine | Admitting: Internal Medicine

## 2021-09-08 DIAGNOSIS — Z8673 Personal history of transient ischemic attack (TIA), and cerebral infarction without residual deficits: Secondary | ICD-10-CM | POA: Diagnosis not present

## 2021-09-08 DIAGNOSIS — I10 Essential (primary) hypertension: Secondary | ICD-10-CM | POA: Diagnosis not present

## 2021-09-08 DIAGNOSIS — R002 Palpitations: Secondary | ICD-10-CM | POA: Diagnosis present

## 2021-09-08 DIAGNOSIS — C73 Malignant neoplasm of thyroid gland: Secondary | ICD-10-CM | POA: Diagnosis not present

## 2021-09-08 DIAGNOSIS — R0989 Other specified symptoms and signs involving the circulatory and respiratory systems: Secondary | ICD-10-CM | POA: Diagnosis not present

## 2021-09-08 DIAGNOSIS — Z79899 Other long term (current) drug therapy: Secondary | ICD-10-CM | POA: Diagnosis not present

## 2021-09-08 LAB — BASIC METABOLIC PANEL
Anion gap: 8 (ref 5–15)
BUN: 13 mg/dL (ref 6–20)
CO2: 24 mmol/L (ref 22–32)
Calcium: 9.7 mg/dL (ref 8.9–10.3)
Chloride: 105 mmol/L (ref 98–111)
Creatinine, Ser: 0.77 mg/dL (ref 0.44–1.00)
GFR, Estimated: >60 mL/min (ref >60)
Glucose, Bld: 93 mg/dL (ref 70–99)
Potassium: 4 mmol/L (ref 3.5–5.1)
Sodium: 137 mmol/L (ref 135–145)

## 2021-09-08 LAB — CBC
HCT: 42.4 % (ref 36.0–46.0)
Hemoglobin: 14.7 g/dL (ref 12.0–15.0)
MCH: 30.3 pg (ref 26.0–34.0)
MCHC: 34.7 g/dL (ref 30.0–36.0)
MCV: 87.4 fL (ref 80.0–100.0)
Platelets: 289 10*3/uL (ref 150–400)
RBC: 4.85 MIL/uL (ref 3.87–5.11)
RDW: 11.9 % (ref 11.5–15.5)
WBC: 5.6 10*3/uL (ref 4.0–10.5)
nRBC: 0 % (ref 0.0–0.2)

## 2021-09-08 LAB — TROPONIN I (HIGH SENSITIVITY)
Troponin I (High Sensitivity): 11 ng/L (ref <18)
Troponin I (High Sensitivity): 11 ng/L (ref <18)

## 2021-09-08 LAB — PREGNANCY, URINE: Preg Test, Ur: NEGATIVE

## 2021-09-08 LAB — TSH: TSH: 0.397 u[IU]/mL (ref 0.350–4.500)

## 2021-09-08 LAB — D-DIMER, QUANTITATIVE: D-Dimer, Quant: <0.27 ug/mL-FEU (ref 0.00–0.50)

## 2021-09-08 MED ORDER — ONDANSETRON HCL 4 MG PO TABS: 4.0000 mg | ORAL_TABLET | Freq: Four times a day (QID) | ORAL | Status: DC | PRN

## 2021-09-08 MED ORDER — LEVOTHYROXINE SODIUM 112 MCG PO TABS: 56.0000 ug | ORAL_TABLET | ORAL | Status: DC

## 2021-09-08 MED ORDER — LEVOTHYROXINE SODIUM 112 MCG PO TABS
112.0000 ug | ORAL_TABLET | ORAL | Status: DC
  Filled 2021-09-08 (×2): qty 1

## 2021-09-08 MED ORDER — HYDRALAZINE HCL 20 MG/ML IJ SOLN: 5.0000 mg | INTRAMUSCULAR | Status: DC | PRN

## 2021-09-08 MED ORDER — OXYCODONE HCL 5 MG PO TABS: 5.0000 mg | ORAL_TABLET | ORAL | Status: DC | PRN

## 2021-09-08 MED ORDER — BISACODYL 5 MG PO TBEC: 5.0000 mg | DELAYED_RELEASE_TABLET | Freq: Every day | ORAL | Status: DC | PRN

## 2021-09-08 MED ORDER — ENOXAPARIN SODIUM 40 MG/0.4ML IJ SOSY: 40.0000 mg | PREFILLED_SYRINGE | INTRAMUSCULAR | Status: DC

## 2021-09-08 MED ORDER — POLYETHYLENE GLYCOL 3350 17 G PO PACK: 17.0000 g | PACK | Freq: Every day | ORAL | Status: DC | PRN

## 2021-09-08 MED ORDER — ACETAMINOPHEN 325 MG PO TABS: 650.0000 mg | ORAL_TABLET | Freq: Four times a day (QID) | ORAL | Status: DC | PRN

## 2021-09-08 MED ORDER — ACETAMINOPHEN 650 MG RE SUPP: 650.0000 mg | Freq: Four times a day (QID) | RECTAL | Status: DC | PRN

## 2021-09-08 MED ORDER — DOCUSATE SODIUM 100 MG PO CAPS
100.0000 mg | ORAL_CAPSULE | Freq: Two times a day (BID) | ORAL | Status: DC
  Administered 2021-09-08 – 2021-09-09 (×2): 100 mg via ORAL
  Filled 2021-09-08 (×2): qty 1

## 2021-09-08 MED ORDER — SODIUM CHLORIDE 0.9% FLUSH
3.0000 mL | Freq: Two times a day (BID) | INTRAVENOUS | Status: DC
  Administered 2021-09-08 – 2021-09-09 (×2): 3 mL via INTRAVENOUS

## 2021-09-08 MED ORDER — ONDANSETRON HCL 4 MG/2ML IJ SOLN: 4.0000 mg | Freq: Four times a day (QID) | INTRAMUSCULAR | Status: DC | PRN

## 2021-09-08 NOTE — ED Notes (Signed)
Pt was provided urine collection container and supplies to collect 24-hour urine catch.  ? ?Pt was educated on how to collect specimen and keep urine cold on ice. ? ?Pt started first urine catch at 16:00 09/08/2021. ? ?Pt urine catch will conclude 16:00 09/09/2021 ?

## 2021-09-08 NOTE — ED Notes (Signed)
Pt stated she has been having a lot things out the ordinary happening. Pt wanted to know about her adrenal gland functioning and if she has a clot. RN notified EDP about pt concerns.  ?

## 2021-09-08 NOTE — H&P (Signed)
?History and Physical  ? ? ?Patient: Krystal Jefferson DXA:[id number] DOB: 27-Aug-1985 ?DOA: 09/08/2021 ?DOS: the patient was seen and examined on 09/08/2021 ?PCP: Mindi Curling, PA-C  ?Patient coming from: Home - lives with husband and 4 children (ages 77 to 52 months); NOK: Husband, Anjalina Bergevin, [phone] ? ? ?Chief Complaint: HTN ? ?HPI: Krystal Jefferson is a 36 y.o. female with medical history significant of seizure d/o no longer on medications and papillary thyroid CA presenting with HTN.   She reports that for the last couple of days, she has paroxysms of HTN, flushing, and palpitations every time she tries to lie down.  She has had 3 of these episodes in the last 24 hours.  Since her headache resolved last night, she has had a persistent and generalized headache.  She has a small area of numbness along the left dorsal foot that is worse when her BP is elevated.  Her highest overnight BP was 170s/110s. ? ?Other medical problems in her lifetime include h/o benign focal epilepsy of childhood and thyroid papillary cancer (mother also had this and grandfather had a brain tumor).  She is still breast feeding.  Had some HTN issues in pregnancy. ? ? ? ?ER Course:   Paroxysmal racing heart, HTN for a couple of days.  ?pheo symptoms.  HTN on arrival, resolved on its own and then asymptomatic.  ?MEN2A, 24 hour work up for possible pheo. ? ? ? ? ?Review of Systems: As mentioned in the history of present illness. All other systems reviewed and are negative. ?Past Medical History:  ?Diagnosis Date  ? Benign focal epilepsy of childhood ([place])   ? Cancer [facility])   ? papillary thryoid  ? Seizures ([place])   ? last seizure age 4 or 58, no seizure meds since age 16 or 38  ? SVD (spontaneous vaginal delivery) 11/05/2012  ? ?Past Surgical History:  ?Procedure Laterality Date  ? NODE DISSECTION N/A 06/18/2014  ? Procedure: LIMITED NODE DISSECTION;  Surgeon: Armandina Gemma, MD;  Location: [facility];  Service: General;  Laterality: N/A;  ?  THYROIDECTOMY N/A 06/18/2014  ? Procedure: TOTAL THYROIDECTOMY WITH LIMITED NODE DISSECTION;  Surgeon: Armandina Gemma, MD;  Location: [facility];  Service: General;  Laterality: N/A;  ? WISDOM TOOTH EXTRACTION    ? ?Social History:  reports that she has never smoked. She has never used smokeless tobacco. She reports that she does not currently use alcohol. She reports that she does not use drugs. ? ?No Known Allergies ? ?Family History  ?Problem Relation Age of Onset  ? Cancer Mother   ? Diverticulitis Father   ? ? ?Prior to Admission medications   ?Medication Sig Start Date End Date Taking? Authorizing Provider  ?Calcium Carb-Cholecalciferol (CALCIUM + D3) 600-200 MG-UNIT TABS Take 2 tablets by mouth daily.   Yes [provider]  ?Cholecalciferol 100 MCG (4000 UT) CAPS Take 1 capsule by mouth daily.    Yes [provider]  ?ibuprofen (ADVIL) 200 MG tablet Take 400 mg by mouth every 6 (six) hours as needed for headache or mild pain.   Yes [provider]  ?levothyroxine (SYNTHROID) 112 MCG tablet Take 56-112 mcg by mouth See admin instructions. 157mcg on Monday, Tuesday, Wednesday, Thursday, Friday. ?Nothing on Saturday. ?26mcg on Sunday   Yes [provider]  ?Prenatal Vit-Fe Fumarate-FA (PRENATAL MULTIVITAMIN) TABS tablet Take 1 tablet by mouth daily at 12 noon.   Yes [provider]  ?levothyroxine (SYNTHROID) 137 MCG tablet Take  1 tablet (137 mcg total) by mouth daily before breakfast. ?Patient not taking: Reported on 09/08/2021 10/09/18   Krystal Jefferson, [place]  ? ? ?Physical Exam: ?Vitals:  ? 09/08/21 0515 09/08/21 0816 09/08/21 1100 09/08/21 1450  ?BP: (!) 138/91 129/88 120/86 120/85  ?Pulse: 80 77 77 73  ?Resp: $Remov'17 17 17 16  'STpyDy$ ?Temp:  98.4 ?F (36.9 ?C) 98 ?F (36.7 ?C) 98 ?F (36.7 ?C)  ?TempSrc:  Oral Oral Oral  ?SpO2: 95% 98% 98% 98%  ? ?General:  Appears calm and comfortable and is in NAD ?Eyes:   EOMI, normal lids, iris ?ENT:  grossly normal hearing, lips & tongue, mmm ?Neck:   no LAD, masses or thyromegaly ?Cardiovascular:  RRR, no m/r/g. No LE edema.  ?Respiratory:   CTA bilaterally with no wheezes/rales/rhonchi.  Normal respiratory effort. ?Abdomen:  soft, NT, ND, NABS ?Back:   normal alignment, no CVAT ?Skin:  no rash or induration seen on limited exam ?Musculoskeletal:  grossly normal tone BUE/BLE, good ROM, no bony abnormality ?Psychiatric:  anxious mood and affect, speech fluent and appropriate, AOx3 ?Neurologic:  CN 2-12 grossly intact, moves all extremities in coordinated fashion ? ? ?Radiological Exams on Admission: ?Independently reviewed - see discussion in A/P where applicable ? ?DG Chest 2 View ? ?Result Date: 09/08/2021 ?CLINICAL DATA:  Elevated blood pressure. EXAM: CHEST - 2 VIEW COMPARISON:  June 13, 2014 FINDINGS: The heart size and mediastinal contours are within normal limits. Both lungs are clear. The visualized skeletal structures are unremarkable. IMPRESSION: No active cardiopulmonary disease. Electronically Signed   By: Virgina Norfolk M.D.   On: 09/08/2021 01:35   ? ?EKG: Independently reviewed.   ?0104 - NSR with rate 77; nonspecific ST changes with no evidence of acute ischemia ?1240 - NSR with rate 66; NSCSLT ? ? ?Labs on Admission: I have personally reviewed the available labs and imaging studies at the time of the admission. ? ?Pertinent labs:  ? ?Normal BMP ?HS troponin 11, 11 ?Normal CBC ?TSH 0.397 ?Upreg negative  ? ? ?Assessment and Plan: ?Principal Problem: ?  Labile hypertension ?Active Problems: ?  Papillary thyroid carcinoma ([place]) ?  ? ?Labile HTN ?-Patient presenting with paroxysms of HTN and headache ?-Also with some palpitations ?-No sweating ?-This is somewhat concerning for hyperadrenergic spells c/w pheochromocytoma ?-She also has a h/o thyroid carcinoma and so may be more at risk if she has MEN-2 as a familial predisposition; however, MEN2 is associated with medullary cancer and she had papillary cancer so this is less likely ?-Will observe  overnight on telemetry  ?-Will order 24h catecholamines and metanephrines ?-EDP also ordered an adrenal CT ?-If negative evaluation, would also consider MRI brain to r/o MS ? ?Thyroid carcinoma ?-s/p thyroidectomy ?-Closely followed by endocrinology ?-She reports lower thyroid hormone need during and following this most recent pregnancy ? ?Postpartum ?-[facility] since she is still breast feeding ?-She will need another adult caregiver present at all times while the infant is present ? ? ? ?Advance Care Planning:   Code Status: Full Code  ? ?Consults: TOC team ? ?DVT Prophylaxis: Lovenox ? ?Family Communication: None present; she is capable of communicating with family at this time ? ?Severity of Illness: ?The appropriate patient status for this patient is OBSERVATION. Observation status is judged to be reasonable and necessary in order to provide the required intensity of service to ensure the patient's safety. The patient's presenting symptoms, physical exam findings, and  initial radiographic and laboratory data in the context of their medical condition is felt to place them at decreased risk for further clinical deterioration. Furthermore, it is anticipated that the patient will be medically stable for discharge from the hospital within 2 midnights of admission.  ? ?Author: ?Karmen Bongo, MD ?09/08/2021 4:44 PM ? ?For on call review [email or web address].  ?

## 2021-09-08 NOTE — ED Triage Notes (Signed)
Pt reported to ED with high blood pressure x2 days. States she was seen at urgent care for the same. Pt appears to be anxious at tie of triage.  ?

## 2021-09-08 NOTE — ED Notes (Signed)
Pt stated when she has these high BP episodes there is a numbness sensation starting at her left ankle down to the mid foot. She states it has been going on for 4 weeks now. EDP made aware ?

## 2021-09-08 NOTE — ED Provider Triage Note (Signed)
Emergency Medicine Provider Triage Evaluation Note ? ?[name] , a 36 y.o. female  was evaluated in triage.  Pt complains of elevated blood pressures.  Patient states that she has been having high blood pressure with no prior history over the last 2 days.  She was seen in urgent care and was told that if she continues to have high blood pressure she needs to go to the ER.  She states that her blood pressure here today in triage was the highest that its been.  She also endorses chest pain and left arm tingling.  States that she feels dizzy at times ambulation.  Endorses a headache, but no vision changes, nausea or vomiting.  She states she is very concerned about her blood pressure.  She has a history of thyroid cancer, and is on Synthroid ? ?Review of Systems  ?Positive: As above ?Negative: As above ? ?Physical Exam  ?BP (!) 176/111   Pulse 86   Temp 98.9 ?F (37.2 ?C) (Oral)   Resp 16   SpO2 98%  ?Gen:   Awake, no distress   ?Resp:  Normal effort  ?MSK:   Moves extremities without difficulty  ?Other:  Neuro exam grossly intact, no facial droop, speaking full sentences, equal strength in upper and lower extremities ? ?Medical Decision Making  ?Medically screening exam initiated at 1:12 AM.  Appropriate orders placed.  JUDIANNE SEIPLE was informed that the remainder of the evaluation will be completed by another provider, this initial triage assessment does not replace that evaluation, and the importance of remaining in the ED until their evaluation is complete. ? ? ?  ?Garald Balding, PA-C ?09/08/21 0114 ? ?

## 2021-09-08 NOTE — ED Provider Notes (Signed)
?[place] ?Provider Note ? ? ?CSN: [id number] ?Arrival date & time: 09/08/21  0055 ? ?  ? ?History ? ?Chief Complaint  ?Patient presents with  ? Hypertension  ? ? ?Krystal Jefferson is a 35 y.o. female. ? ? ?Hypertension ?Associated symptoms include headaches and shortness of breath. Patient presents for paroxysmal episodes of elevated blood pressure, shortness of breath, and dizziness.  She has been experiencing these episodes nightly for the past 2 days.  When they occur, she reports that she is very uncomfortable.  She has no history of hypertension.  Medical history is notable for previous papillary thyroid carcinoma, s/p thyroidectomy.  She is currently on Synthroid and has had a recent dose reduction.  She is currently 3 months postpartum.  She has no other significant medical history.  Patient arrived in the ED last night symptomatic.  Blood pressure was elevated at that time.  She remained in the waiting room with subsequent resolution of symptoms.  Currently, patient is asymptomatic. ? ?  ? ?Home Medications ?Prior to Admission medications   ?Medication Sig Start Date End Date Taking? Authorizing Provider  ?Calcium Carb-Cholecalciferol (CALCIUM + D3) 600-200 MG-UNIT TABS Take 2 tablets by mouth daily.   Yes [provider]  ?Cholecalciferol 100 MCG (4000 UT) CAPS Take 1 capsule by mouth daily.    Yes [provider]  ?ibuprofen (ADVIL) 200 MG tablet Take 400 mg by mouth every 6 (six) hours as needed for headache or mild pain.   Yes [provider]  ?levothyroxine (SYNTHROID) 112 MCG tablet Take 56-112 mcg by mouth See admin instructions. 153mg on Monday, Tuesday, Wednesday, Thursday, Friday. ?Nothing on Saturday. ?520m on Sunday   Yes [provider]  ?Prenatal Vit-Fe Fumarate-FA (PRENATAL MULTIVITAMIN) TABS tablet Take 1 tablet by mouth daily at 12 noon.   Yes [provider]  ?   ? ?Allergies    ?Patient has no known  allergies.   ? ?Review of Systems   ?Review of Systems  ?Respiratory:  Positive for shortness of breath.   ?Cardiovascular:  Positive for palpitations.  ?Neurological:  Positive for dizziness and headaches.  ? ?Physical Exam ?Updated Vital Signs ?BP 120/85 (BP Location: Left Arm)   Pulse 73   Temp 98 ?F (36.7 ?C) (Oral)   Resp 16   SpO2 98%  ?Physical Exam ?Vitals and nursing note reviewed.  ?Constitutional:   ?   General: She is not in acute distress. ?   Appearance: Normal appearance. She is well-developed and normal weight. She is not ill-appearing, toxic-appearing or diaphoretic.  ?HENT:  ?   Head: Normocephalic and atraumatic.  ?   Right Ear: External ear normal.  ?   Left Ear: External ear normal.  ?   Nose: Nose normal.  ?   Mouth/Throat:  ?   Mouth: Mucous membranes are moist.  ?   Pharynx: Oropharynx is clear.  ?Eyes:  ?   Extraocular Movements: Extraocular movements intact.  ?   Conjunctiva/sclera: Conjunctivae normal.  ?Cardiovascular:  ?   Rate and Rhythm: Normal rate and regular rhythm.  ?   Heart sounds: No murmur heard. ?Pulmonary:  ?   Effort: Pulmonary effort is normal. No respiratory distress.  ?Abdominal:  ?   Palpations: Abdomen is soft.  ?   Tenderness: There is no abdominal tenderness.  ?Musculoskeletal:     ?   General: No swelling. Normal range of motion.  ?   Cervical back: Normal range of  motion and neck supple.  ?Skin: ?   General: Skin is warm and dry.  ?   Coloration: Skin is not jaundiced or pale.  ?Neurological:  ?   General: No focal deficit present.  ?   Mental Status: She is alert and oriented to person, place, and time.  ?   Cranial Nerves: No cranial nerve deficit.  ?   Sensory: No sensory deficit.  ?   Motor: No weakness.  ?   Coordination: Coordination normal.  ?Psychiatric:     ?   Mood and Affect: Mood normal.     ?   Behavior: Behavior normal.     ?   Thought Content: Thought content normal.     ?   Judgment: Judgment normal.  ? ? ?ED Results / Procedures / Treatments    ?Labs ?(all labs ordered are listed, but only abnormal results are displayed) ?Labs Reviewed  ?CBC  ?BASIC METABOLIC PANEL  ?PREGNANCY, URINE  ?TSH  ?D-DIMER, QUANTITATIVE  ?METANEPHRINES, PLASMA  ?METANEPHRINES, URINE, 24 HOUR  ?CATECHOLAMINES,UR.,FREE,24 HR  ?HIV ANTIBODY (ROUTINE TESTING W REFLEX)  ?TROPONIN I (HIGH SENSITIVITY)  ?TROPONIN I (HIGH SENSITIVITY)  ? ? ?EKG ?None ? ?Radiology ?DG Chest 2 View ? ?Result Date: 09/08/2021 ?CLINICAL DATA:  Elevated blood pressure. EXAM: CHEST - 2 VIEW COMPARISON:  June 13, 2014 FINDINGS: The heart size and mediastinal contours are within normal limits. Both lungs are clear. The visualized skeletal structures are unremarkable. IMPRESSION: No active cardiopulmonary disease. Electronically Signed   By: Virgina Norfolk M.D.   On: 09/08/2021 01:35  ? ?CT ADRENAL ABDOMEN WO CONTRAST ? ?Result Date: 09/08/2021 ?CLINICAL DATA:  Abdominal mass, hypertension EXAM: CT ABDOMEN WITHOUT CONTRAST TECHNIQUE: Multidetector CT imaging of the abdomen was performed following the standard protocol without IV contrast. RADIATION DOSE REDUCTION: This exam was performed according to the departmental dose-optimization program which includes automated exposure control, adjustment of the mA and/or kV according to patient size and/or use of iterative reconstruction technique. COMPARISON:  09/08/2021 FINDINGS: Lower chest: No acute pleural or parenchymal lung disease. Hepatobiliary: Unremarkable unenhanced appearance of the liver and gallbladder. Pancreas: Unremarkable unenhanced appearance. Spleen: Unremarkable unenhanced appearance. Adrenals/Urinary Tract: No urinary tract calculi or obstructive uropathy within either kidney. Visualized portions of the ureters are unremarkable. The adrenals are grossly normal. Stomach/Bowel: No bowel obstruction or ileus. No bowel wall thickening or inflammatory change. Vascular/Lymphatic: Unremarkable unenhanced appearance of the aorta. No pathologic  adenopathy. Other: No free fluid or free intraperitoneal gas. No abdominal wall hernia. Musculoskeletal: No acute or destructive bony lesions. Reconstructed images demonstrate no additional findings. IMPRESSION: 1. Unremarkable unenhanced CT of the abdomen. Electronically Signed   By: Randa Ngo M.D.   On: 09/08/2021 17:43   ? ?Procedures ?Procedures  ? ? ?Medications Ordered in ED ?Medications  ?enoxaparin (LOVENOX) injection 40 mg (has no administration in time range)  ?sodium chloride flush (NS) 0.9 % injection 3 mL (has no administration in time range)  ?acetaminophen (TYLENOL) tablet 650 mg (has no administration in time range)  ?  Or  ?acetaminophen (TYLENOL) suppository 650 mg (has no administration in time range)  ?oxyCODONE (Oxy IR/ROXICODONE) immediate release tablet 5 mg (has no administration in time range)  ?docusate sodium (COLACE) capsule 100 mg (has no administration in time range)  ?polyethylene glycol (MIRALAX / GLYCOLAX) packet 17 g (has no administration in time range)  ?bisacodyl (DULCOLAX) EC tablet 5 mg (has no administration in time range)  ?ondansetron (ZOFRAN) tablet 4 mg (has no  administration in time range)  ?  Or  ?ondansetron (ZOFRAN) injection 4 mg (has no administration in time range)  ?hydrALAZINE (APRESOLINE) injection 5 mg (has no administration in time range)  ?levothyroxine (SYNTHROID) tablet 112 mcg (has no administration in time range)  ?levothyroxine (SYNTHROID) tablet 56 mcg (has no administration in time range)  ? ? ?ED Course/ Medical Decision Making/ A&P ?  ?                        ?Medical Decision Making ?Amount and/or Complexity of Data Reviewed ?Labs: ordered. ? ?Risk ?Decision regarding hospitalization. ? ? ?This patient presents to the ED for concern of episodes of hypertension, palpitations, headache, and dizziness, this involves an extensive number of treatment options, and is a complaint that carries with it a high risk of complications and morbidity.  The  differential diagnosis includes pheochromocytoma, anxiety, hypothyroidism ? ? ?Co morbidities that complicate the patient evaluation ? ?Papillary thyroid carcinoma s/p total thyroidectomy ? ? ?Additional history obtaine

## 2021-09-09 DIAGNOSIS — R0989 Other specified symptoms and signs involving the circulatory and respiratory systems: Secondary | ICD-10-CM | POA: Diagnosis not present

## 2021-09-09 LAB — HIV ANTIBODY (ROUTINE TESTING W REFLEX): HIV Screen 4th Generation wRfx: NONREACTIVE

## 2021-09-09 NOTE — Discharge Summary (Signed)
Physician Discharge Summary  ?Krystal Jefferson HKV:[id number] DOB: [birth year] DOA: 09/08/2021 ? ?PCP: Mindi Curling, PA-C ? ?Admit date: 09/08/2021 ?Discharge date: 09/09/2021 ? ?Admitted From: Home ?Disposition:  Home ? ?Discharge Condition:Stable ?CODE STATUS:FULL ?Diet recommendation:  Regular ?Brief/Interim Summary: ?Krystal Jefferson is a 36 y.o. female with medical history significant of seizure d/o no longer on medications and papillary thyroid CA presenting with HTN.   She reports that for the last couple of days, she has paroxysms of HTN, flushing, and palpitations every time she tries to lie down.  She has had 3 of these episodes in the last 24 hours.  Since her headache resolved last night, she has had a persistent and generalized headache.  She has a small area of numbness along the left dorsal foot that is worse when her BP is elevated.  Her highest overnight BP was 170s/110s. ? Other medical problems in her lifetime include h/o benign focal epilepsy of childhood and thyroid papillary cancer (mother also had this and grandfather had a brain tumor).  She is still breast feeding.  Had some HTN issues in pregnancy.  She was initially hypertensive on presentation but her blood pressure since last 24 hours is completely normal.  We collected urine and blood sample for plasma/urine metanephrine/catecholamine to rule out pheochromocytoma.  CT adrenals did not show any acute abnormalities/no evidence of adrenal mass.  She is medically stable for discharge to home today.  She will follow-up with her PCP/endocrinologist within a week to follow-up on the lab works and further work-up if necessary. ? ?Following problems were addressed during her hospitalization: ? ?Labile HTN ?-Patient presenting with paroxysms of HTN and headache ?-Also with some palpitations ?-No sweating ?-This is somewhat concerning for hyperadrenergic spells c/w pheochromocytoma ?-Admitted for  overnight observation  ?-Collected  24h  catecholamines and plasma metanephrines ?-Normal  adrenal CT ?-She has remained normotensive since last 24 hours ?  ?Thyroid carcinoma ?-s/p thyroidectomy ?-Closely followed by endocrinology ?-She reports lower thyroid hormone need during and following this most recent pregnancy ?  ? ? ? ? ?Discharge Diagnoses:  ?Principal Problem: ?  Labile hypertension ?Active Problems: ?  Papillary thyroid carcinoma ([place]) ? ? ? ?Discharge Instructions ? ?Discharge Instructions   ? ? Diet general   Complete by: As directed ?  ? Discharge instructions   Complete by: As directed ?  ? 1)Please monitor your blood pressure at home ?2)Follow up with your PCP within a week to follow up on the blood and urine work done here and further work up if necessary  ? Increase activity slowly   Complete by: As directed ?  ? ?  ? ?Allergies as of 09/09/2021   ?No Known Allergies ?  ? ?  ?Medication List  ?  ? ?TAKE these medications   ? ?Calcium + D3 600-200 MG-UNIT Tabs ?Take 2 tablets by mouth daily. ?  ?Cholecalciferol 100 MCG (4000 UT) Caps ?Take 1 capsule by mouth daily. ?  ?ibuprofen 200 MG tablet ?Commonly known as: ADVIL ?Take 400 mg by mouth every 6 (six) hours as needed for headache or mild pain. ?  ?prenatal multivitamin Tabs tablet ?Take 1 tablet by mouth daily at 12 noon. ?  ?Synthroid 112 MCG tablet ?Generic drug: levothyroxine ?Take 56-112 mcg by mouth See admin instructions. 152mg on Monday, Tuesday, Wednesday, Thursday, Friday. ?Nothing on Saturday. ?583m on Sunday ?  ? ?  ? ? Follow-up Information   ? ? GoMindi CurlingPA-C. Schedule an appointment  as soon as possible for a visit in 1 week(s).   ?Specialty: Physician Assistant ?Contact information: ?[address] ?[place] [address] ?[phone] ? ? ?  ?  ? ?  ?  ? ?  ? ?No Known Allergies ? ?Consultations: ?None ? ? ?Procedures/Studies: ?DG Chest 2 View ? ?Result Date: 09/08/2021 ?CLINICAL DATA:  Elevated blood pressure. EXAM: CHEST - 2 VIEW COMPARISON:   June 13, 2014 FINDINGS: The heart size and mediastinal contours are within normal limits. Both lungs are clear. The visualized skeletal structures are unremarkable. IMPRESSION: No active cardiopulmonary disease. Electronically Signed   By: Virgina Norfolk M.D.   On: 09/08/2021 01:35  ? ?CT ADRENAL ABDOMEN WO CONTRAST ? ?Result Date: 09/08/2021 ?CLINICAL DATA:  Abdominal mass, hypertension EXAM: CT ABDOMEN WITHOUT CONTRAST TECHNIQUE: Multidetector CT imaging of the abdomen was performed following the standard protocol without IV contrast. RADIATION DOSE REDUCTION: This exam was performed according to the departmental dose-optimization program which includes automated exposure control, adjustment of the mA and/or kV according to patient size and/or use of iterative reconstruction technique. COMPARISON:  09/08/2021 FINDINGS: Lower chest: No acute pleural or parenchymal lung disease. Hepatobiliary: Unremarkable unenhanced appearance of the liver and gallbladder. Pancreas: Unremarkable unenhanced appearance. Spleen: Unremarkable unenhanced appearance. Adrenals/Urinary Tract: No urinary tract calculi or obstructive uropathy within either kidney. Visualized portions of the ureters are unremarkable. The adrenals are grossly normal. Stomach/Bowel: No bowel obstruction or ileus. No bowel wall thickening or inflammatory change. Vascular/Lymphatic: Unremarkable unenhanced appearance of the aorta. No pathologic adenopathy. Other: No free fluid or free intraperitoneal gas. No abdominal wall hernia. Musculoskeletal: No acute or destructive bony lesions. Reconstructed images demonstrate no additional findings. IMPRESSION: 1. Unremarkable unenhanced CT of the abdomen. Electronically Signed   By: Randa Ngo M.D.   On: 09/08/2021 17:43   ? ? ? ?Subjective: ?Patient seen and examined at the bedside this morning.  Hemodynamically stable.  Comfortable.  Normal blood pressure.  Discharge planning thoroughly explained at bedside in  detail. ? ?Discharge Exam: ?Vitals:  ? 09/09/21 0600 09/09/21 0809  ?BP: 125/87 129/89  ?Pulse: 70 71  ?Resp: 18 18  ?Temp: (!) 97.4 ?F (36.3 ?C) 98.3 ?F (36.8 ?C)  ?SpO2: 98% 98%  ? ?Vitals:  ? 09/08/21 1450 09/08/21 2008 09/09/21 0600 09/09/21 0809  ?BP: 120/85 121/90 125/87 129/89  ?Pulse: 73  70 71  ?Resp: '16  18 18  '$ ?Temp: 98 ?F (36.7 ?C)  (!) 97.4 ?F (36.3 ?C) 98.3 ?F (36.8 ?C)  ?TempSrc: Oral  Oral Oral  ?SpO2: 98%  98% 98%  ? ? ?General: Pt is alert, awake, not in acute distress ?Cardiovascular: RRR, S1/S2 +, no rubs, no gallops ?Respiratory: CTA bilaterally, no wheezing, no rhonchi ?Abdominal: Soft, NT, ND, bowel sounds + ?Extremities: no edema, no cyanosis ? ? ? ?The results of significant diagnostics from this hospitalization (including imaging, microbiology, ancillary and laboratory) are listed below for reference.   ? ? ?Microbiology: ?No results found for this or any previous visit (from the past 240 hour(s)).  ? ?Labs: ?BNP (last 3 results) ?No results for input(s): BNP in the last 8760 hours. ?Basic Metabolic Panel: ?Recent Labs  ?Lab 09/08/21 ?0126  ?NA 137  ?K 4.0  ?CL 105  ?CO2 24  ?GLUCOSE 93  ?BUN 13  ?CREATININE 0.77  ?CALCIUM 9.7  ? ?Liver Function Tests: ?No results for input(s): AST, ALT, ALKPHOS, BILITOT, PROT, ALBUMIN in the last 168 hours. ?No results for input(s): LIPASE, AMYLASE in the  last 168 hours. ?No results for input(s): AMMONIA in the last 168 hours. ?CBC: ?Recent Labs  ?Lab 09/08/21 ?0126  ?WBC 5.6  ?HGB 14.7  ?HCT 42.4  ?MCV 87.4  ?PLT 289  ? ?Cardiac Enzymes: ?No results for input(s): CKTOTAL, CKMB, CKMBINDEX, TROPONINI in the last 168 hours. ?BNP: ?Invalid input(s): POCBNP ?CBG: ?No results for input(s): GLUCAP in the last 168 hours. ?D-Dimer ?Recent Labs  ?  09/08/21 ?1328  ?DDIMER <0.27  ? ?Hgb A1c ?No results for input(s): HGBA1C in the last 72 hours. ?Lipid Profile ?No results for input(s): CHOL, HDL, LDLCALC, TRIG, CHOLHDL, LDLDIRECT in the last 72 hours. ?Thyroid  function studies ?Recent Labs  ?  09/08/21 ?0126  ?TSH 0.397  ? ?Anemia work up ?No results for input(s): VITAMINB12, FOLATE, FERRITIN, TIBC, IRON, RETICCTPCT in the last 72 hours. ?Urinalysis ?   ?Component Value Date/T

## 2021-09-09 NOTE — Plan of Care (Signed)
  Problem: Nutrition: Goal: Adequate nutrition will be maintained Outcome: Progressing   Problem: Pain Managment: Goal: General experience of comfort will improve Outcome: Progressing   Problem: Safety: Goal: Ability to remain free from injury will improve Outcome: Progressing   

## 2021-09-09 NOTE — TOC Progression Note (Signed)
Transition of Care (TOC) - Progression Note  ? ? ?Patient Details  ?Name: MARIS ABASCAL ?MRN: [id number] ?Date of Birth: 10/26/1985 ? ?Transition of Care (TOC) CM/SW Contact  ?Marilu Favre, RN ?Phone Number: ?09/09/2021, 8:26 AM ? ?Clinical Narrative:    ? ?Consult for home health needs and DME . Patient 36 years old , independent ADL's . No PT/OT evaluations ordered.  ? ?Patient has PCP and insurance.  ? ? ? ?Transition of Care (TOC) Screening Note ? ? ?Patient Details  ?Name: CATLYN SHIPTON ?Date of Birth: 06-14-1985 ? ? ?Transition of Care Department [facility]) has reviewed patient and no TOC needs have been identified at this time. We will continue to monitor patient advancement through interdisciplinary progression rounds. If new patient transition needs arise, please place a TOC consult. ?  ? ? ?  ?  ? ?Expected Discharge Plan and Services ?  ?  ?  ?  ?  ?                ?  ?  ?  ?  ?  ?  ?  ?  ?  ?  ? ? ?Social Determinants of Health (SDOH) Interventions ?  ? ?Readmission Risk Interventions ?   ? View : No data to display.  ?  ?  ?  ? ? ?

## 2021-09-09 NOTE — Progress Notes (Signed)
Krystal Jefferson to be D/C'd  per MD order.  Discussed with the patient and all questions fully answered. ? ?VSS, Skin clean, dry and intact without evidence of skin break down, no evidence of skin tears noted. ? ?IV catheter discontinued intact. Site without signs and symptoms of complications. Dressing and pressure applied. ? ?An After Visit Summary was printed and given to the patient.  ? ?D/c re-educate completed with patient/family including follow up instructions, medication list, d/c activities limitations if indicated, with other d/c instructions as indicated by MD - patient able to verbalize understanding, all questions fully answered.  ? ?Patient instructed to return to ED, call 911, or call MD for any changes in condition.  ? ?Patient to be escorted via [place], and D/C home via private auto.  ?

## 2021-09-12 LAB — CATECHOLAMINES,UR.,FREE,24 HR
Dopamine, Rand Ur: 109 ug/L
Dopamine, Ur, 24Hr: 349 ug/24 hr (ref 0–510)
Epinephrine, Rand Ur: 3 ug/L
Epinephrine, U, 24Hr: 10 ug/24 hr (ref 0–20)
Norepinephrine, Rand Ur: 11 ug/L
Norepinephrine,U,24H: 35 ug/24 hr (ref 0–135)
Total Volume: 3200

## 2021-09-12 LAB — METANEPHRINES, URINE, 24 HOUR
Metaneph Total, Ur: 36 ug/L
Metanephrines, 24H Ur: 115 ug/24 hr (ref 36–209)
Normetanephrine, 24H Ur: 250 ug/24 hr (ref 131–612)
Normetanephrine, Ur: 78 ug/L
Total Volume: 3200

## 2021-09-12 LAB — METANEPHRINES, PLASMA
Metanephrine, Free: 33.3 pg/mL (ref 0.0–88.0)
Normetanephrine, Free: 97.9 pg/mL (ref 0.0–210.1)
